# Patient Record
Sex: Female | Born: 1985 | Race: Black or African American | Hispanic: No | Marital: Single | Smoking: Current every day smoker
Health system: Southern US, Community
[De-identification: ages and names within clinical notes are randomized; demographics above are authoritative.]

## PROBLEM LIST (undated history)

## (undated) ENCOUNTER — Inpatient Hospital Stay (HOSPITAL_COMMUNITY): Payer: Self-pay

## (undated) DIAGNOSIS — G5603 Carpal tunnel syndrome, bilateral upper limbs: Secondary | ICD-10-CM

## (undated) DIAGNOSIS — E669 Obesity, unspecified: Secondary | ICD-10-CM

## (undated) DIAGNOSIS — G43909 Migraine, unspecified, not intractable, without status migrainosus: Secondary | ICD-10-CM

## (undated) HISTORY — PX: ANKLE ARTHROPLASTY: SUR68

---
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
## 2004-02-11 ENCOUNTER — Inpatient Hospital Stay (HOSPITAL_COMMUNITY): Admission: AD | Admit: 2004-02-11 | Discharge: 2004-02-11 | Payer: Self-pay | Admitting: Obstetrics and Gynecology

## 2004-03-05 ENCOUNTER — Emergency Department (HOSPITAL_COMMUNITY): Admission: EM | Admit: 2004-03-05 | Discharge: 2004-03-05 | Payer: Self-pay | Admitting: Emergency Medicine

## 2004-03-09 ENCOUNTER — Emergency Department (HOSPITAL_COMMUNITY): Admission: EM | Admit: 2004-03-09 | Discharge: 2004-03-09 | Payer: Self-pay | Admitting: Emergency Medicine

## 2004-03-23 ENCOUNTER — Other Ambulatory Visit: Admission: RE | Admit: 2004-03-23 | Discharge: 2004-03-23 | Payer: Self-pay | Admitting: Obstetrics and Gynecology

## 2004-04-22 ENCOUNTER — Ambulatory Visit (HOSPITAL_COMMUNITY): Admission: RE | Admit: 2004-04-22 | Discharge: 2004-04-22 | Payer: Self-pay | Admitting: *Deleted

## 2004-05-18 ENCOUNTER — Ambulatory Visit: Payer: Self-pay | Admitting: Family Medicine

## 2004-05-27 ENCOUNTER — Inpatient Hospital Stay (HOSPITAL_COMMUNITY): Admission: AD | Admit: 2004-05-27 | Discharge: 2004-05-27 | Payer: Self-pay | Admitting: *Deleted

## 2004-05-27 ENCOUNTER — Ambulatory Visit: Payer: Self-pay | Admitting: Family Medicine

## 2004-06-06 ENCOUNTER — Inpatient Hospital Stay (HOSPITAL_COMMUNITY): Admission: AD | Admit: 2004-06-06 | Discharge: 2004-06-06 | Payer: Self-pay | Admitting: Family Medicine

## 2004-09-13 ENCOUNTER — Inpatient Hospital Stay (HOSPITAL_COMMUNITY): Admission: AD | Admit: 2004-09-13 | Discharge: 2004-09-15 | Payer: Self-pay | Admitting: Obstetrics and Gynecology

## 2004-09-13 ENCOUNTER — Ambulatory Visit: Payer: Self-pay | Admitting: Obstetrics & Gynecology

## 2004-11-12 ENCOUNTER — Emergency Department (HOSPITAL_COMMUNITY): Admission: EM | Admit: 2004-11-12 | Discharge: 2004-11-12 | Payer: Self-pay | Admitting: Emergency Medicine

## 2004-12-30 ENCOUNTER — Emergency Department (HOSPITAL_COMMUNITY): Admission: EM | Admit: 2004-12-30 | Discharge: 2004-12-30 | Payer: Self-pay | Admitting: Emergency Medicine

## 2005-03-28 ENCOUNTER — Emergency Department (HOSPITAL_COMMUNITY): Admission: EM | Admit: 2005-03-28 | Discharge: 2005-03-28 | Payer: Self-pay | Admitting: Emergency Medicine

## 2005-03-30 ENCOUNTER — Emergency Department (HOSPITAL_COMMUNITY): Admission: EM | Admit: 2005-03-30 | Discharge: 2005-03-30 | Payer: Self-pay | Admitting: Emergency Medicine

## 2005-06-24 ENCOUNTER — Emergency Department (HOSPITAL_COMMUNITY): Admission: EM | Admit: 2005-06-24 | Discharge: 2005-06-24 | Payer: Self-pay | Admitting: Emergency Medicine

## 2005-08-26 ENCOUNTER — Inpatient Hospital Stay (HOSPITAL_COMMUNITY): Admission: AD | Admit: 2005-08-26 | Discharge: 2005-08-26 | Payer: Self-pay | Admitting: *Deleted

## 2005-09-16 ENCOUNTER — Inpatient Hospital Stay (HOSPITAL_COMMUNITY): Admission: AD | Admit: 2005-09-16 | Discharge: 2005-09-16 | Payer: Self-pay | Admitting: *Deleted

## 2005-11-09 ENCOUNTER — Ambulatory Visit (HOSPITAL_COMMUNITY): Admission: RE | Admit: 2005-11-09 | Discharge: 2005-11-09 | Payer: Self-pay | Admitting: Obstetrics

## 2006-01-20 ENCOUNTER — Ambulatory Visit (HOSPITAL_COMMUNITY): Admission: RE | Admit: 2006-01-20 | Discharge: 2006-01-20 | Payer: Self-pay | Admitting: Obstetrics

## 2006-02-16 ENCOUNTER — Inpatient Hospital Stay (HOSPITAL_COMMUNITY): Admission: AD | Admit: 2006-02-16 | Discharge: 2006-02-16 | Payer: Self-pay | Admitting: Obstetrics & Gynecology

## 2006-02-20 ENCOUNTER — Emergency Department (HOSPITAL_COMMUNITY): Admission: EM | Admit: 2006-02-20 | Discharge: 2006-02-21 | Payer: Self-pay | Admitting: Emergency Medicine

## 2006-03-02 ENCOUNTER — Ambulatory Visit (HOSPITAL_COMMUNITY): Admission: RE | Admit: 2006-03-02 | Discharge: 2006-03-03 | Payer: Self-pay | Admitting: Orthopedic Surgery

## 2006-03-06 ENCOUNTER — Emergency Department (HOSPITAL_COMMUNITY): Admission: EM | Admit: 2006-03-06 | Discharge: 2006-03-06 | Payer: Self-pay | Admitting: Emergency Medicine

## 2006-03-22 ENCOUNTER — Inpatient Hospital Stay (HOSPITAL_COMMUNITY): Admission: AD | Admit: 2006-03-22 | Discharge: 2006-03-22 | Payer: Self-pay | Admitting: Obstetrics

## 2006-03-30 ENCOUNTER — Encounter (INDEPENDENT_AMBULATORY_CARE_PROVIDER_SITE_OTHER): Payer: Self-pay | Admitting: *Deleted

## 2006-03-30 ENCOUNTER — Inpatient Hospital Stay (HOSPITAL_COMMUNITY): Admission: AD | Admit: 2006-03-30 | Discharge: 2006-04-01 | Payer: Self-pay | Admitting: Obstetrics

## 2006-05-14 ENCOUNTER — Emergency Department (HOSPITAL_COMMUNITY): Admission: EM | Admit: 2006-05-14 | Discharge: 2006-05-14 | Payer: Self-pay | Admitting: Emergency Medicine

## 2006-07-27 ENCOUNTER — Emergency Department (HOSPITAL_COMMUNITY): Admission: EM | Admit: 2006-07-27 | Discharge: 2006-07-27 | Payer: Self-pay | Admitting: Emergency Medicine

## 2006-08-06 IMAGING — US US OB FOLLOW-UP
1 series · 13 of 28 positions shown · non-contrast
Comparison: none

CLINICAL DATA: 30 week 1 day assigned gestational age by prior ultrasound.  Follow-up fetal anatomy not visualized on prior study.  Evaluate growth.

[Series 1: us ob follow-up · 13 of 51 slices shown]
[im 2/51]
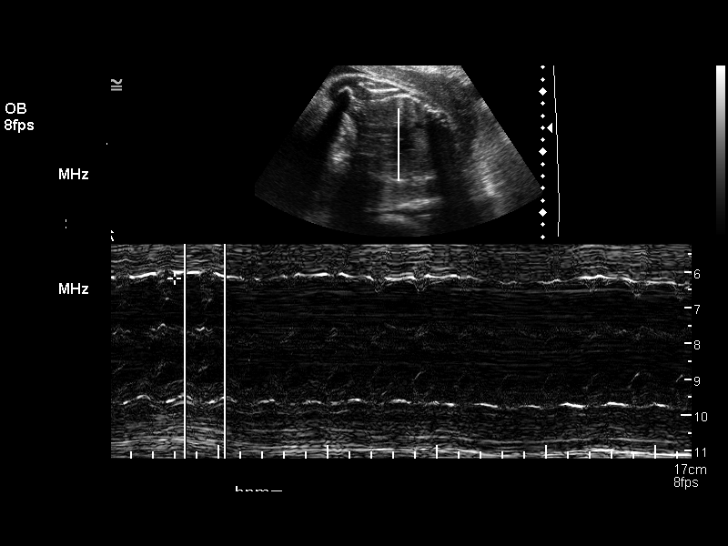
[im 6/51]
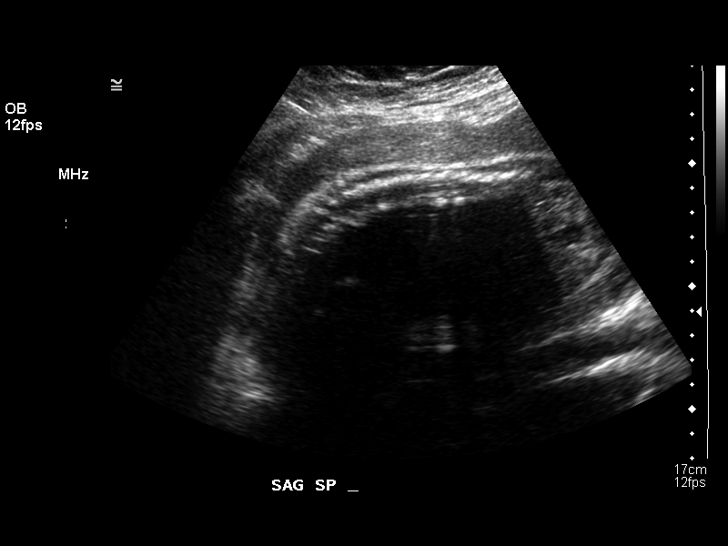
[im 10/51]
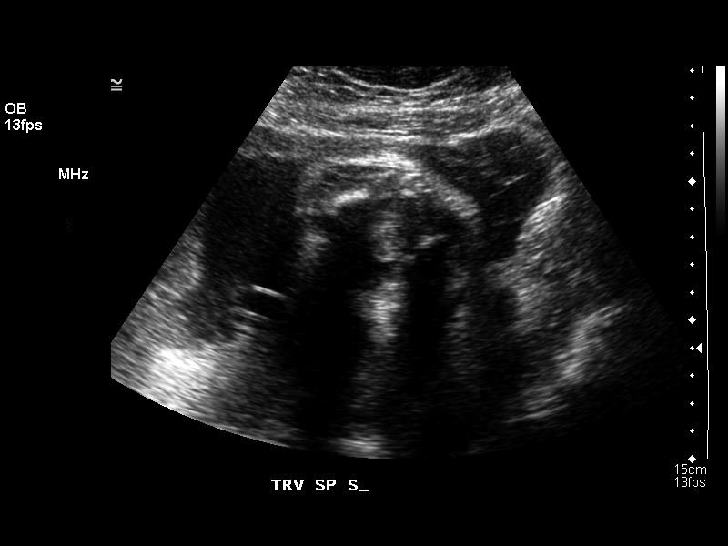
[im 13/51]
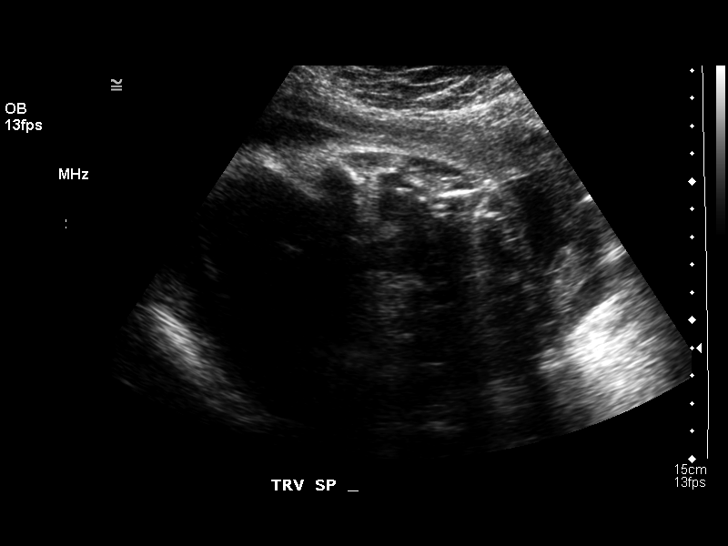
[im 17/51]
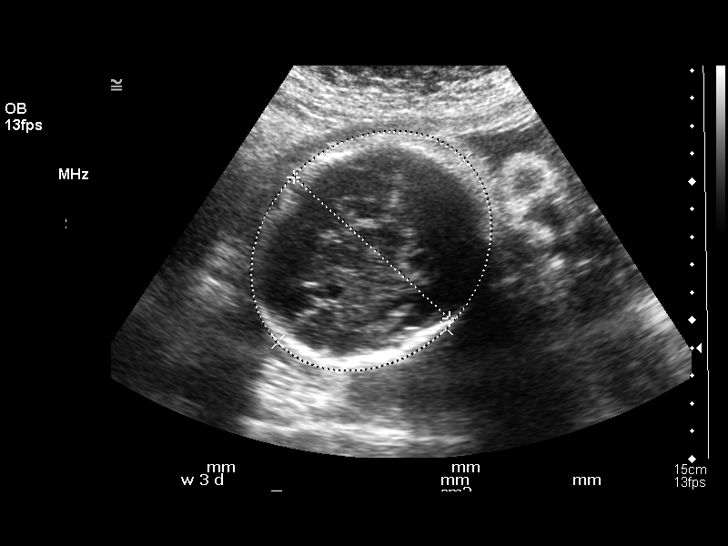
[im 21/51]
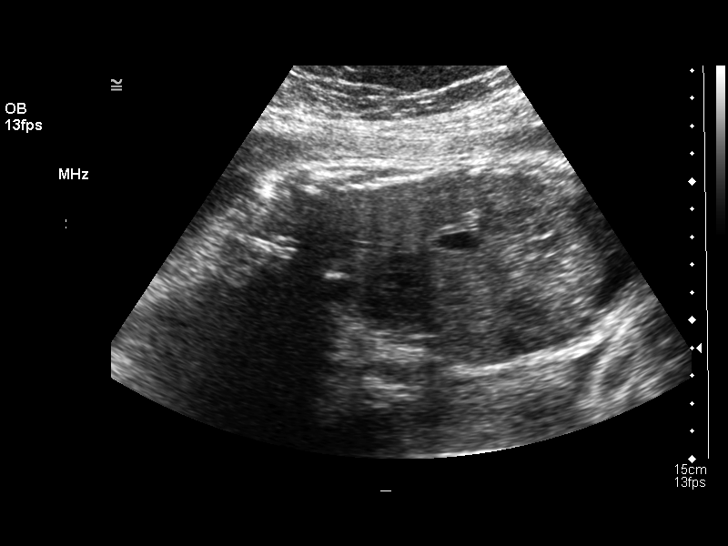
[im 26/51]
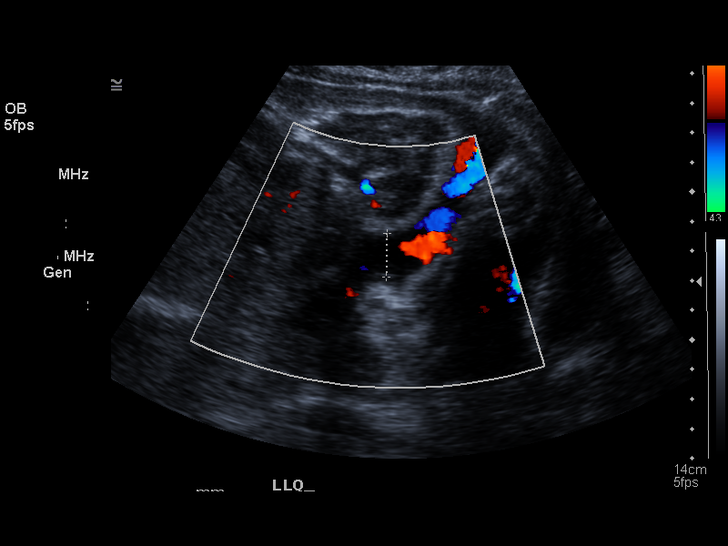
[im 30/51]
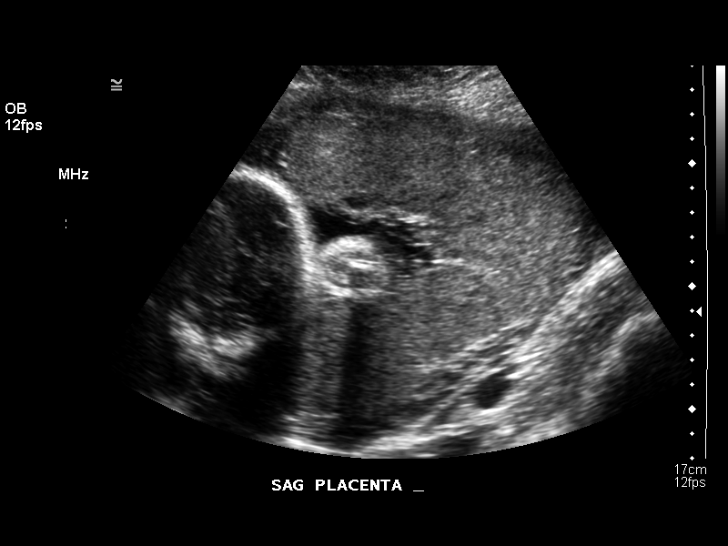
[im 34/51]
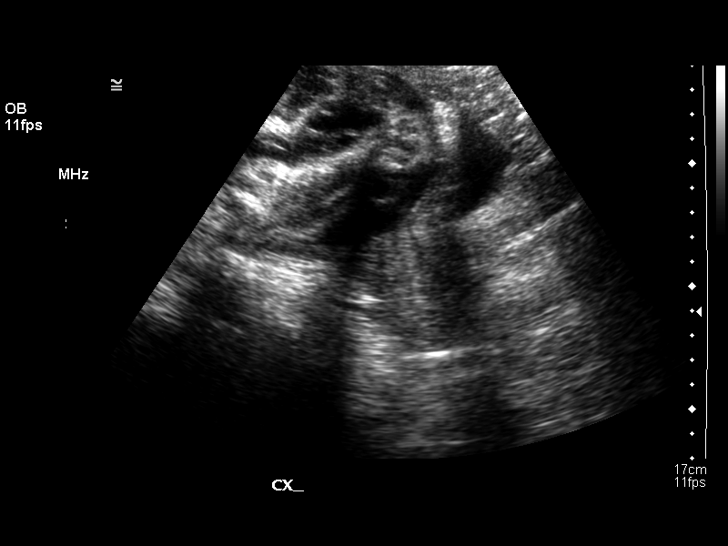
[im 38/51]
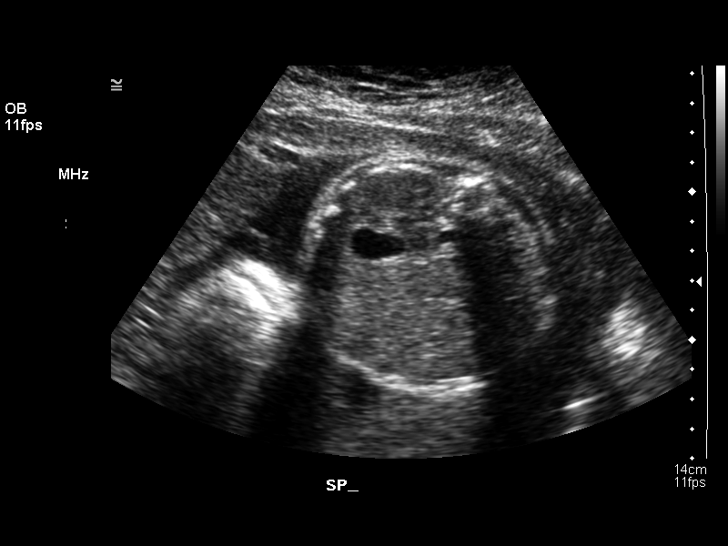
[im 41/51]
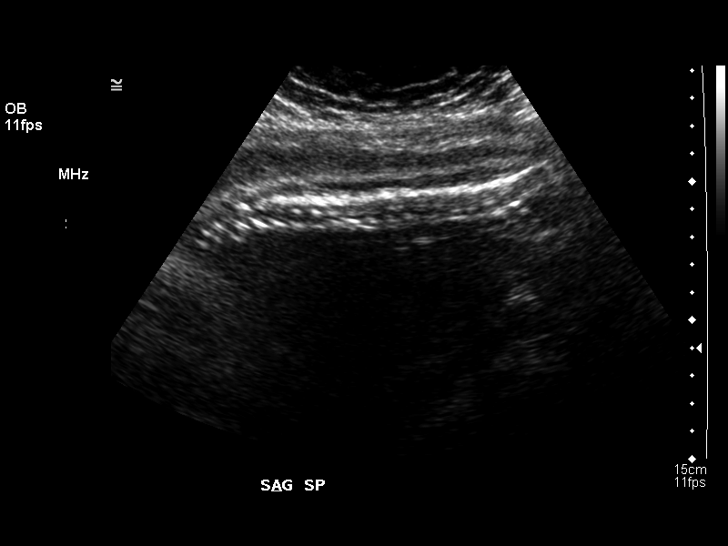
[im 45/51]
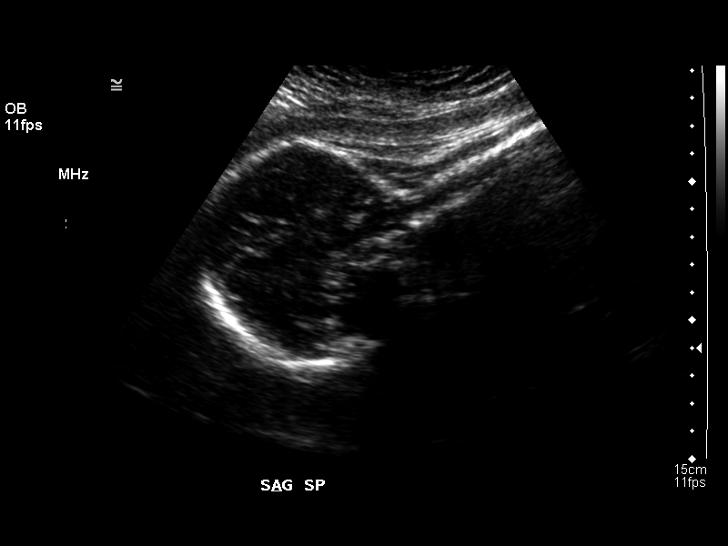
[im 49/51]
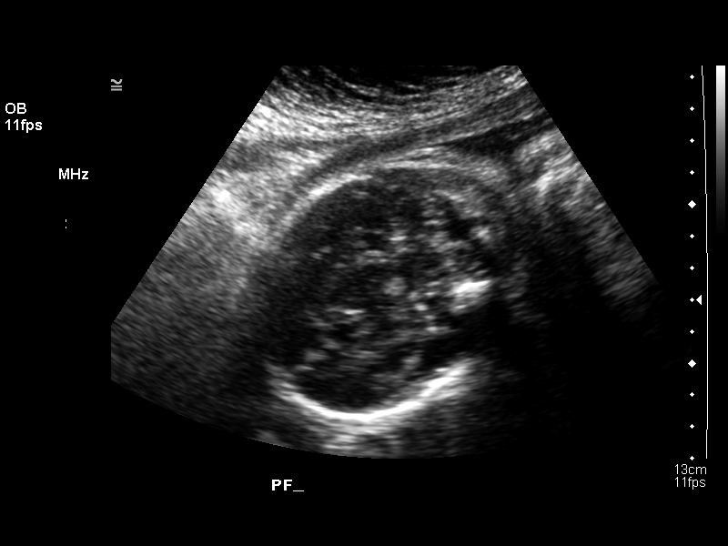

[13 of 28 positions shown; findings below may reference images not displayed]

OBSTETRICAL ULTRASOUND RE-EVALUATION:
 Number of Fetuses: 1
 Heart Rate:  163
 Movement:  Yes
 Breathing:  No
 Presentation:  Frank breech
 Placental Location:  Right lateral 
 Grade:  I
 Previa:  No
 Amniotic Fluid (subjective):  Normal
 Amniotic Fluid (objective):  14.9 cm AFI (5th -95th%ile = 9.0 – 23.4 cm for 30 wks)

 FETAL BIOMETRY
 BPD:  7.4 cm   29 w 4 d
 HC:  27.1 cm   29 w 4 d
 AC:  26.4 cm   30 w 4 d
 FL:  5.9 cm   30 w 6 d

 Mean GA:  30 w 1 d  US EDC:  03/30/06
 Assigned GA:  30 w 1 d  Assigned EDC:  03/30/06

 EFW: 4074 g (H) 75th – 90th%ile (5755 – 6773 g) For 30 wks

 FETAL ANATOMY
 Lateral Ventricles:  Visualized 
 Thalami/CSP:  Visualized 
 Posterior Fossa:  Visualized 
 Nuchal Region:  Previously seen 
 Spine:  Visualized 
 4 Chamber Heart on Left:  Previously seen 
 Stomach on Left:  Visualized 
 3 Vessel Cord:  Visualized 
 Cord Insertion Site:  Previously seen 
 Kidneys:  Visualized 
 Bladder:  Visualized 
 Extremities:  Previously seen 
 MATERNAL UTERINE AND ADNEXAL FINDINGS
 Cervix:4.7 cm Transabdominally.
IMPRESSION: 1.  Assigned gestational age is currently 30 weeks 1 day by prior ultrasound.  Appropriate fetal growth with EFW at 75th percentile.
 2.  No fetal anatomic abnormality identified.  Fetal spine visualized on today’s study.

## 2006-09-07 IMAGING — CR DG ANKLE PORT 2V*R*
2 series · 2 of 2 positions shown · non-contrast
Comparison: none

HISTORY: Bimalleolar fracture, postreduction

PORTABLE RIGHT ANKLE 2 VIEWS:
Portable exam 5665 hours compared to 02/20/2006
Bimalleolar fractures again seen.
Talar subluxation reduced.
Improved alignment medial malleolar fracture fragment.
Slightly increased displacement of distal fibular fracture.

[view not recorded (1 of 2)]
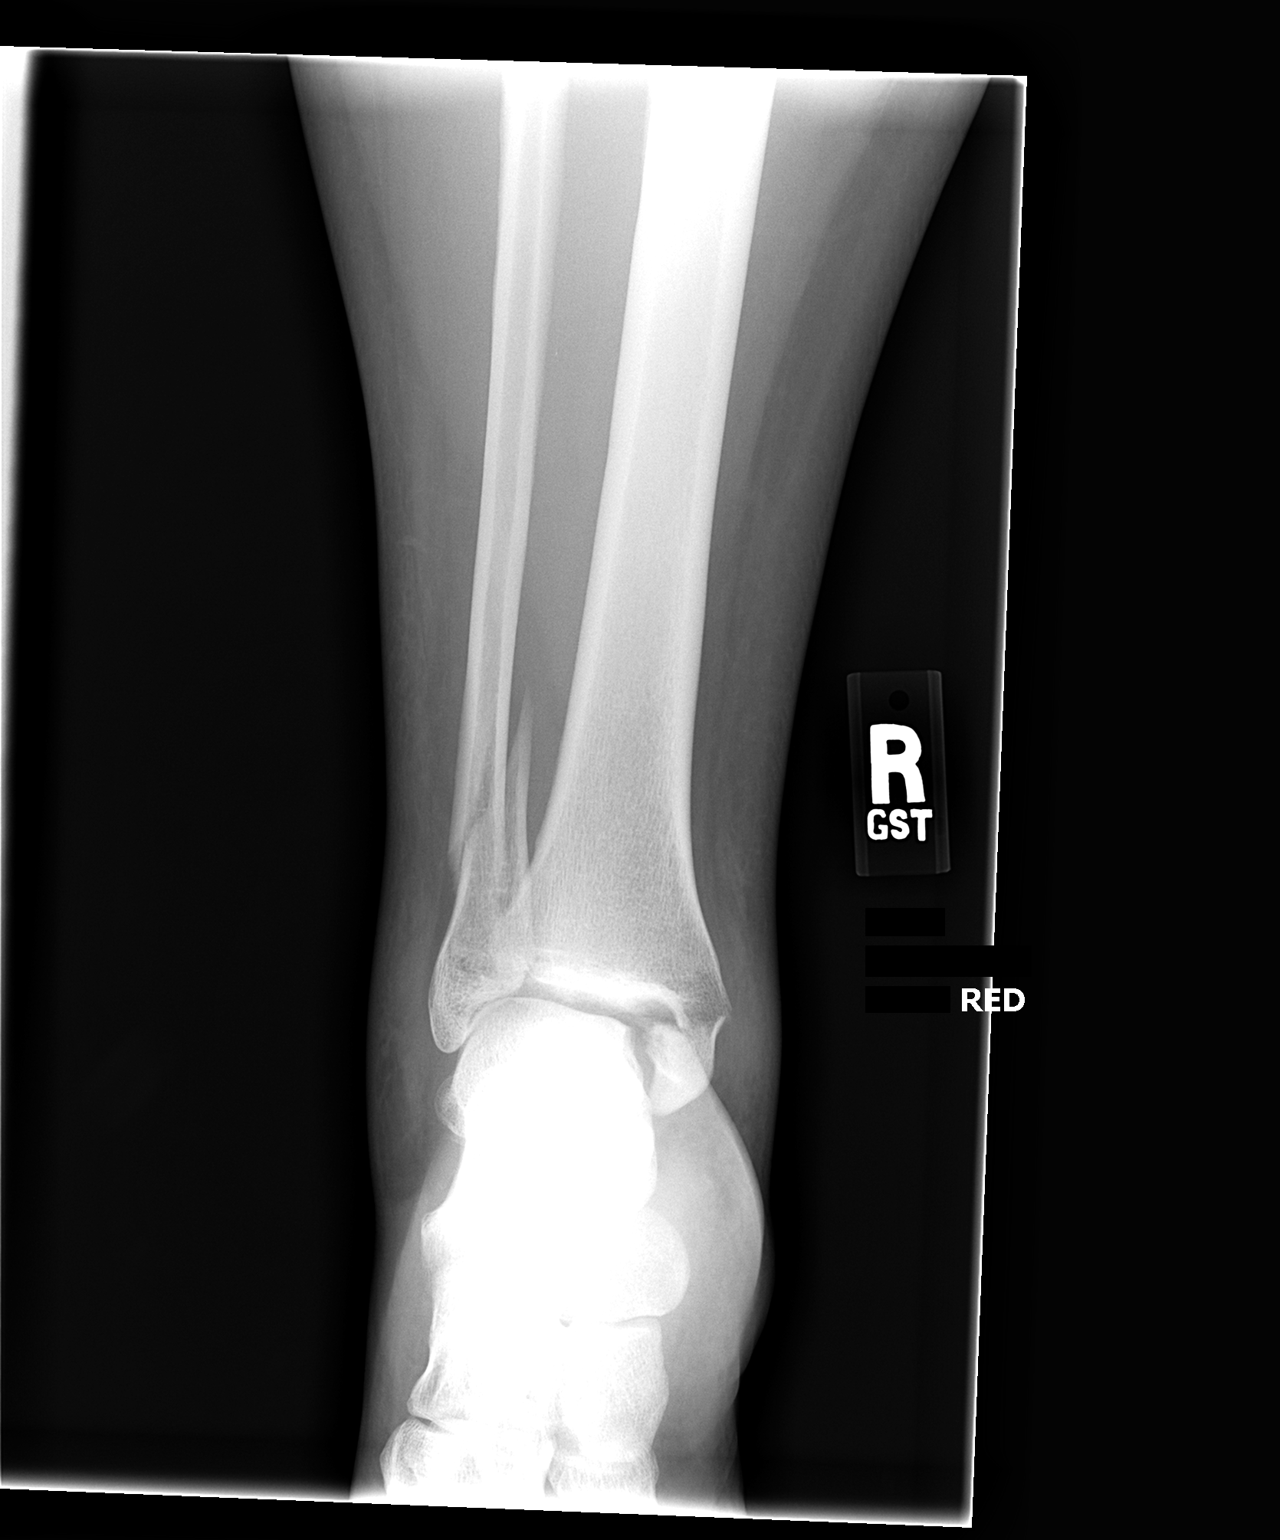

[view not recorded (2 of 2)]
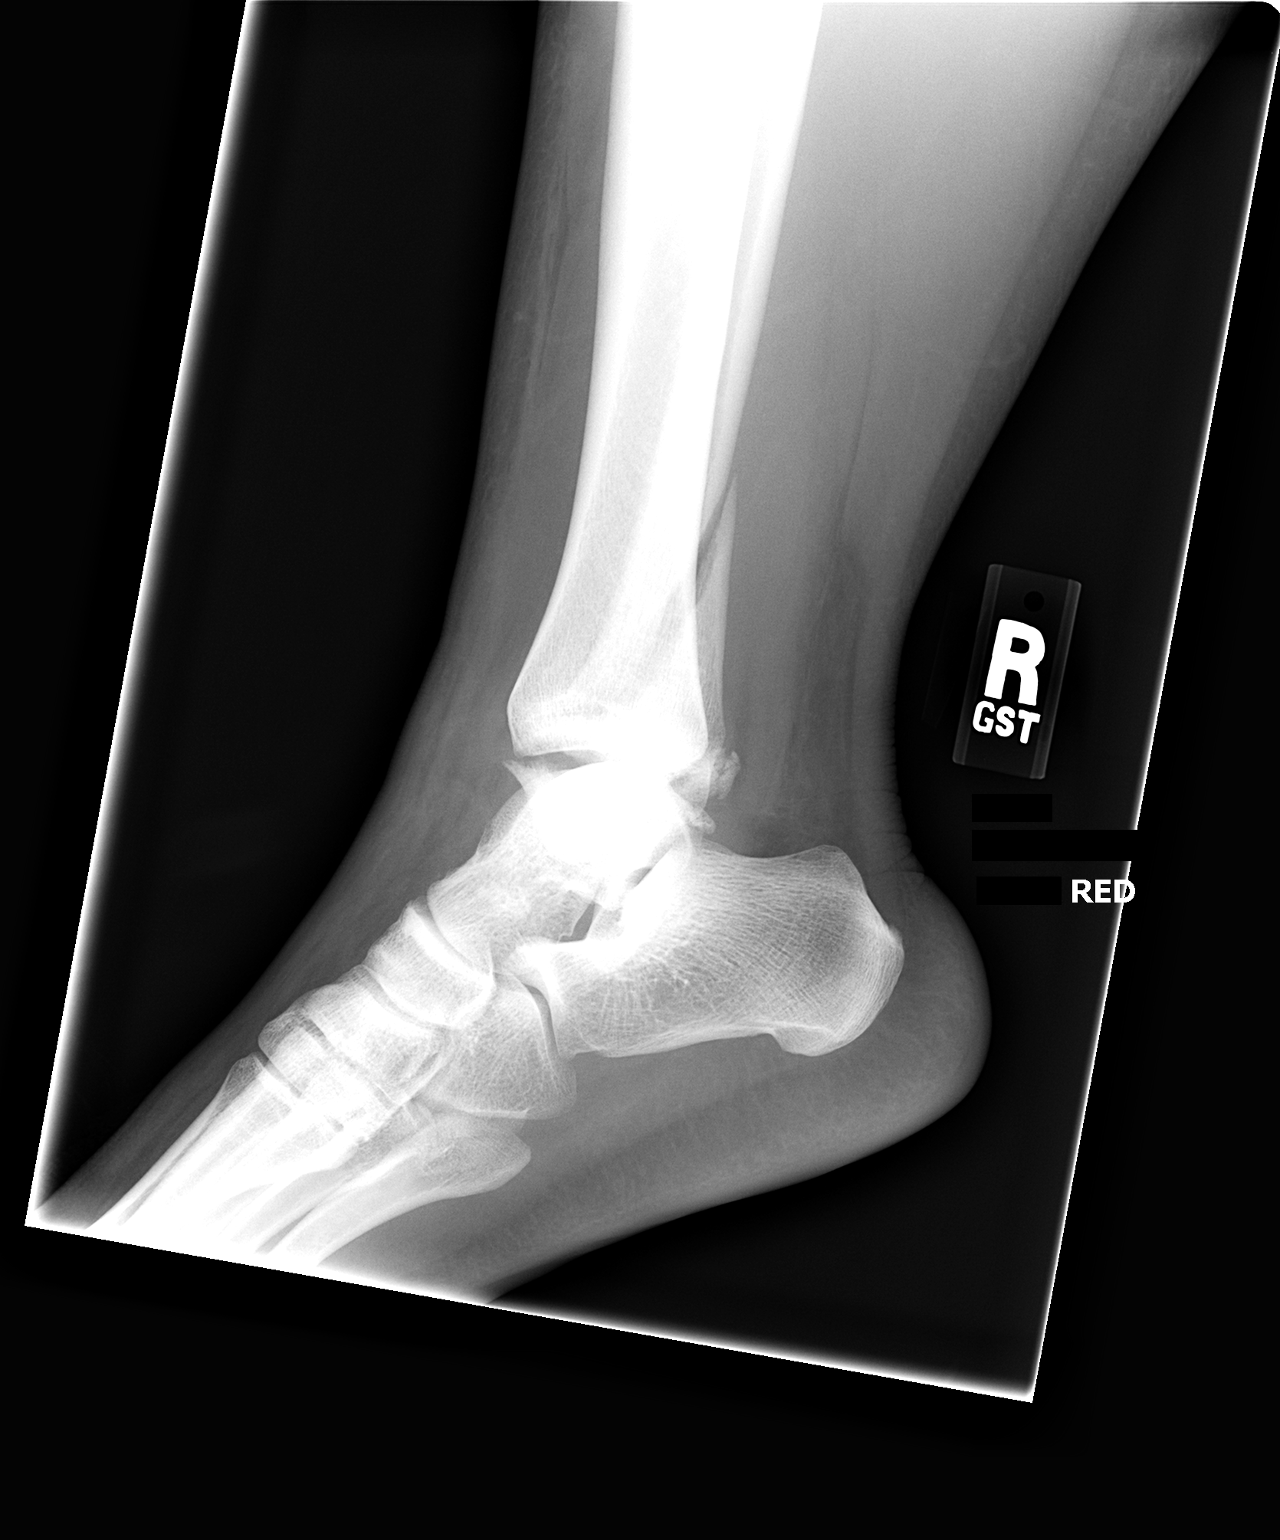

[2 of 2 positions shown; findings below may reference images not displayed]

IMPRESSION: Reduction of tibiotalar subluxation with bimalleolar fractures as above.

## 2006-09-08 ENCOUNTER — Emergency Department (HOSPITAL_COMMUNITY): Admission: EM | Admit: 2006-09-08 | Discharge: 2006-09-08 | Payer: Self-pay | Admitting: Emergency Medicine

## 2007-07-21 ENCOUNTER — Emergency Department (HOSPITAL_COMMUNITY): Admission: EM | Admit: 2007-07-21 | Discharge: 2007-07-21 | Payer: Self-pay | Admitting: Emergency Medicine

## 2007-07-31 ENCOUNTER — Emergency Department (HOSPITAL_COMMUNITY): Admission: EM | Admit: 2007-07-31 | Discharge: 2007-07-31 | Payer: Self-pay | Admitting: Emergency Medicine

## 2007-08-27 ENCOUNTER — Emergency Department (HOSPITAL_COMMUNITY): Admission: EM | Admit: 2007-08-27 | Discharge: 2007-08-27 | Payer: Self-pay | Admitting: Emergency Medicine

## 2007-11-09 ENCOUNTER — Emergency Department (HOSPITAL_COMMUNITY): Admission: EM | Admit: 2007-11-09 | Discharge: 2007-11-09 | Payer: Self-pay | Admitting: Emergency Medicine

## 2007-12-27 ENCOUNTER — Emergency Department (HOSPITAL_COMMUNITY): Admission: EM | Admit: 2007-12-27 | Discharge: 2007-12-27 | Payer: Self-pay | Admitting: Emergency Medicine

## 2008-01-07 ENCOUNTER — Emergency Department (HOSPITAL_COMMUNITY): Admission: EM | Admit: 2008-01-07 | Discharge: 2008-01-07 | Payer: Self-pay | Admitting: Emergency Medicine

## 2008-01-11 ENCOUNTER — Emergency Department (HOSPITAL_COMMUNITY): Admission: EM | Admit: 2008-01-11 | Discharge: 2008-01-11 | Payer: Self-pay | Admitting: Emergency Medicine

## 2008-04-21 ENCOUNTER — Emergency Department (HOSPITAL_COMMUNITY): Admission: EM | Admit: 2008-04-21 | Discharge: 2008-04-21 | Payer: Self-pay | Admitting: Family Medicine

## 2008-06-19 ENCOUNTER — Emergency Department (HOSPITAL_COMMUNITY): Admission: EM | Admit: 2008-06-19 | Discharge: 2008-06-19 | Payer: Self-pay | Admitting: Emergency Medicine

## 2008-10-17 ENCOUNTER — Emergency Department (HOSPITAL_COMMUNITY): Admission: EM | Admit: 2008-10-17 | Discharge: 2008-10-18 | Payer: Self-pay | Admitting: Emergency Medicine

## 2009-01-06 ENCOUNTER — Emergency Department (HOSPITAL_COMMUNITY): Admission: EM | Admit: 2009-01-06 | Discharge: 2009-01-06 | Payer: Self-pay | Admitting: Emergency Medicine

## 2009-01-18 ENCOUNTER — Emergency Department (HOSPITAL_COMMUNITY): Admission: EM | Admit: 2009-01-18 | Discharge: 2009-01-18 | Payer: Self-pay | Admitting: Family Medicine

## 2010-02-13 ENCOUNTER — Inpatient Hospital Stay (HOSPITAL_COMMUNITY): Admission: AD | Admit: 2010-02-13 | Discharge: 2010-02-13 | Payer: Self-pay | Admitting: Obstetrics

## 2010-02-13 ENCOUNTER — Ambulatory Visit: Payer: Self-pay | Admitting: Nurse Practitioner

## 2010-04-11 ENCOUNTER — Emergency Department (HOSPITAL_COMMUNITY): Admission: EM | Admit: 2010-04-11 | Discharge: 2010-04-11 | Payer: Self-pay | Admitting: Emergency Medicine

## 2010-04-12 ENCOUNTER — Emergency Department (HOSPITAL_COMMUNITY): Admission: EM | Admit: 2010-04-12 | Discharge: 2010-04-12 | Payer: Self-pay | Admitting: Emergency Medicine

## 2010-04-14 ENCOUNTER — Emergency Department (HOSPITAL_COMMUNITY): Admission: EM | Admit: 2010-04-14 | Discharge: 2010-04-15 | Payer: Self-pay | Admitting: Emergency Medicine

## 2010-11-04 LAB — CULTURE, ROUTINE-ABSCESS

## 2010-11-04 LAB — POCT PREGNANCY, URINE: Preg Test, Ur: NEGATIVE

## 2010-11-07 LAB — CBC
Hemoglobin: 13.1 g/dL (ref 12.0–15.0)
MCH: 22.7 pg — ABNORMAL LOW (ref 26.0–34.0)
Platelets: 294 10*3/uL (ref 150–400)
RBC: 5.79 MIL/uL — ABNORMAL HIGH (ref 3.87–5.11)
RDW: 15.3 % (ref 11.5–15.5)

## 2010-11-07 LAB — POCT PREGNANCY, URINE: Preg Test, Ur: NEGATIVE

## 2010-11-30 LAB — WET PREP, GENITAL: Yeast Wet Prep HPF POC: NONE SEEN

## 2010-11-30 LAB — COMPREHENSIVE METABOLIC PANEL
ALT: 49 U/L — ABNORMAL HIGH (ref 0–35)
Albumin: 4.1 g/dL (ref 3.5–5.2)
CO2: 21 mEq/L (ref 19–32)
Calcium: 9.6 mg/dL (ref 8.4–10.5)
GFR calc non Af Amer: >60 mL/min (ref >60)
Sodium: 142 mEq/L (ref 135–145)
Total Bilirubin: 0.7 mg/dL (ref 0.3–1.2)

## 2010-11-30 LAB — URINALYSIS, ROUTINE W REFLEX MICROSCOPIC
Glucose, UA: NEGATIVE mg/dL
Nitrite: NEGATIVE
Protein, ur: NEGATIVE mg/dL
Specific Gravity, Urine: 1.023 (ref 1.005–1.030)
pH: 5.5 (ref 5.0–8.0)

## 2010-11-30 LAB — GC/CHLAMYDIA PROBE AMP, GENITAL: Chlamydia, DNA Probe: NEGATIVE

## 2010-11-30 LAB — PREGNANCY, URINE: Preg Test, Ur: NEGATIVE

## 2010-12-12 ENCOUNTER — Inpatient Hospital Stay (HOSPITAL_COMMUNITY): Payer: Medicaid Other

## 2010-12-12 ENCOUNTER — Inpatient Hospital Stay (HOSPITAL_COMMUNITY)
Admission: AD | Admit: 2010-12-12 | Discharge: 2010-12-12 | Disposition: A | Discharge status: Home / Self Care | Payer: Medicaid Other | Source: Ambulatory Visit | Attending: Obstetrics & Gynecology | Admitting: Obstetrics & Gynecology

## 2010-12-12 DIAGNOSIS — N760 Acute vaginitis: Secondary | ICD-10-CM

## 2010-12-12 DIAGNOSIS — A499 Bacterial infection, unspecified: Secondary | ICD-10-CM

## 2010-12-12 DIAGNOSIS — B9689 Other specified bacterial agents as the cause of diseases classified elsewhere: Secondary | ICD-10-CM | POA: Insufficient documentation

## 2010-12-12 DIAGNOSIS — O2390 Unspecified genitourinary tract infection in pregnancy, unspecified trimester: Secondary | ICD-10-CM | POA: Insufficient documentation

## 2010-12-12 DIAGNOSIS — R109 Unspecified abdominal pain: Secondary | ICD-10-CM | POA: Insufficient documentation

## 2010-12-12 DIAGNOSIS — N390 Urinary tract infection, site not specified: Secondary | ICD-10-CM

## 2010-12-12 LAB — URINE MICROSCOPIC-ADD ON

## 2010-12-12 LAB — CBC
HCT: 37.6 % (ref 36.0–46.0)
MCH: 21.5 pg — ABNORMAL LOW (ref 26.0–34.0)
Platelets: 251 10*3/uL (ref 150–400)

## 2010-12-12 LAB — URINALYSIS, ROUTINE W REFLEX MICROSCOPIC
Ketones, ur: NEGATIVE mg/dL
Nitrite: NEGATIVE
Urobilinogen, UA: 0.2 mg/dL (ref 0.0–1.0)
pH: 6 (ref 5.0–8.0)

## 2010-12-12 LAB — WET PREP, GENITAL

## 2010-12-12 LAB — HCG, QUANTITATIVE, PREGNANCY: hCG, Beta Chain, Quant, S: 117094 m[IU]/mL — ABNORMAL HIGH (ref <5)

## 2010-12-12 LAB — POCT PREGNANCY, URINE: Preg Test, Ur: POSITIVE

## 2010-12-13 LAB — GC/CHLAMYDIA PROBE AMP, GENITAL: GC Probe Amp, Genital: NEGATIVE

## 2010-12-14 LAB — URINE CULTURE
Colony Count: >=100000
Culture  Setup Time: 201204221718

## 2011-01-07 NOTE — Discharge Summary (Signed)
   NAME:  Donna Sanders, Donna Sanders                          ACCOUNT NO.:  [id number]   MEDICAL RECORD NO.:  [id number]                   PATIENT TYPE:  INP   LOCATION:  9115                                 FACILITY:  [facility]   PHYSICIAN:  Rodolph Bong, M.D.                  DATE OF BIRTH:  05-02-1986   DATE OF ADMISSION:  09/14/2002  DATE OF DISCHARGE:  09/16/2002                                 DISCHARGE SUMMARY   DISCHARGE DIAGNOSIS:  Normal spontaneous vaginal delivery at 40-0/7 weeks of  a viable female infant with vasa previa/postpartum hemorrhage.   DISCHARGE MEDICATIONS:  1. Ibuprofen 600 mg p.o. q.6h. p.r.n. pain.  2. Ortho-Evra patch one patch every week for three weeks and then one week     patch-free.  3. Ferrous sulfate 325 mg one pill each day with a meal.   BRIEF ADMISSION HISTORY:  This is a 25 year old G1, P0 who presented with an  intrauterine pregnancy at 39 weeks in latent labor.  She was admitted for  further expectant management.   BRIEF HOSPITAL COURSE:  Pregnancy.  The patient was admitted with the  history as outlined above.  She progressed without complications into active  labor and delivered a viable female infant by a spontaneous vaginal delivery  at 0449 hours on September 15, 2002.  Of note, during delivery, patient was  identified to have a vasa previa on the placenta.  She initially did have a  boggy uterus with brisk bleeding.  She received Oxytocin 4 units in her IV  fluids and Cytotec 800 mcg per rectum in addition to uterine massage.  She  had a second-degree laceration repair that was fixed without complication.  The NICU was present at birth secondary to meconium-stained fluid.  The baby  had Apgars of 9 at 1 minute and 9 at 5 minutes.  Over the next two days, the  patient did not have any complications.  She was afebrile and feeling well.  She was discharged on September 16, 2002, in stable condition.   DISCHARGE ACTIVITY:  Pelvic rest was advised for six  weeks.   FOLLOW UP:  At [facility] in six weeks.   LABORATORY DATA:  Discharge labs, hemoglobin 9.0 and hematocrit 27.4 on  September 16, 2002.                                               Rodolph Bong, M.D.    AK/MEDQ  D:  01/01/2003  T:  01/01/2003  Job:  [id number]

## 2011-01-07 NOTE — Op Note (Signed)
Donna Sanders, Donna Sanders NO.:  [id number]   MEDICAL RECORD NO.:  [id number]          PATIENT TYPE:  OIB   LOCATION:  5025                         FACILITY:  [facility]   PHYSICIAN:  Loreta Ave, M.D. DATE OF BIRTH:  May 06, 1986   DATE OF PROCEDURE:  03/02/2006  DATE OF DISCHARGE:  03/03/2006                                 OPERATIVE REPORT   PREOPERATIVE DIAGNOSIS:  Displaced, closed bimalleolar ankle fracture,  right.  Note:  The patient is 8 months pregnant.   POSTOPERATIVE DIAGNOSIS:  Displaced, closed trimalleolar ankle fracture,  right.   PROCEDURE:  Open reduction internal fixation of right ankle fracture,  including fixation of the medial and lateral fractures and closed treatment  of the posterior fracture, medial fixation with 3 cannulated 4.0 lag screws,  lateral fixation with a locking Synthes 6-hole plate.   SURGEON:  Loreta Ave, M.D.   ASSISTANT:  Genene Churn. Denton Meek.   ANESTHESIA:  Spinal.   BLOOD LOSS:  Minimal.   TOURNIQUET TIME:  45 minutes.   SPECIMENS:  None.   CULTURES:  None.   COMPLICATIONS:  None.   DRESSING:  Soft compressive with splint.   DESCRIPTION OF THE PROCEDURE:  The patient was brought to the operating  room, and after adequate anesthesia had been obtained, the tourniquet was  applied to the upper aspect of the right leg.  Splint removed.  Prepped and  draped in the usual, sterile fashion.  Exsanguinated with elevation of  Esmarch.  Tourniquet inflated to 300 mmHg.  Slightly curved incision over  the medial malleolus.  Skin, subcutaneous tissue divided.  Fracture exposed.  Debris cleared out.  Ankle inspected.  Thoroughly irrigated.  Fracture  reduced anatomically.  Relatively large.  After holding it in place  anatomically, 3 guidewire's were passed across the fracture, predrilled and  fixed with 3 cannulated screws.  Excellent alignment, capture and fixation.  All guidewire's were removed.  Lateral approach  with a longitudinal  incision.  Subperiosteal exposure of the fibula.  Tendons protected.  Anatomically reduced and then fixed with a 6-hole, 1/3 tubular locking  plate.  Care taken not to leave screws in the ankle or syndesmosis.  Syndesmosis itself was not disrupted as it was a spiral fracture that  started below the syndesmosis.  At completion, excellent solid and stable  fixation of all fractures.  Posterior fragment in place did not require  further fixation.  Thoroughly inspected as well as fluoroscopic views  through full motion.  The wound was irrigated, closed with  Vicryl, and then staples.  The margins of the wound was injected with  Marcaine.  A sterile compressive dressing and a splint applied.  Tourniquet  inflated removed.  Anesthesia reversed.  Brought to the recovery room.  Tolerated the surgery well.  No complications.      Loreta Ave, M.D.  Electronically Signed     DFM/MEDQ  D:  03/02/2006  T:  03/03/2006  Job:  [id number]

## 2011-05-18 LAB — POCT URINALYSIS DIP (DEVICE)
Glucose, UA: NEGATIVE
Hgb urine dipstick: NEGATIVE
Nitrite: NEGATIVE
Urobilinogen, UA: 1

## 2011-05-18 LAB — POCT PREGNANCY, URINE
Operator id: 114141
Preg Test, Ur: NEGATIVE

## 2011-05-18 LAB — WET PREP, GENITAL

## 2011-05-18 LAB — GC/CHLAMYDIA PROBE AMP, GENITAL: Chlamydia, DNA Probe: NEGATIVE

## 2011-08-11 ENCOUNTER — Emergency Department (HOSPITAL_COMMUNITY)
Admission: EM | Admit: 2011-08-11 | Discharge: 2011-08-12 | Disposition: A | Discharge status: Home / Self Care | Payer: Medicaid Other | Attending: Emergency Medicine | Admitting: Emergency Medicine

## 2011-08-11 ENCOUNTER — Encounter: Payer: Self-pay | Admitting: Emergency Medicine

## 2011-08-11 DIAGNOSIS — F17200 Nicotine dependence, unspecified, uncomplicated: Secondary | ICD-10-CM | POA: Insufficient documentation

## 2011-08-11 DIAGNOSIS — IMO0002 Reserved for concepts with insufficient information to code with codable children: Secondary | ICD-10-CM | POA: Insufficient documentation

## 2011-08-11 DIAGNOSIS — M79609 Pain in unspecified limb: Secondary | ICD-10-CM | POA: Insufficient documentation

## 2011-08-11 DIAGNOSIS — M7989 Other specified soft tissue disorders: Secondary | ICD-10-CM | POA: Insufficient documentation

## 2011-08-11 DIAGNOSIS — J45909 Unspecified asthma, uncomplicated: Secondary | ICD-10-CM | POA: Insufficient documentation

## 2011-08-11 DIAGNOSIS — L02412 Cutaneous abscess of left axilla: Secondary | ICD-10-CM

## 2011-08-11 HISTORY — DX: Obesity, unspecified: E66.9

## 2011-08-11 NOTE — ED Notes (Signed)
PT. REPORTS LEFT AXILLARY ABSCESS WITH PAIN AND SWELLING FOR 2 DAYS WITH NO DRAINAGE.

## 2011-08-12 MED ORDER — DOXYCYCLINE HYCLATE 100 MG PO CAPS: 100.0000 mg | ORAL_CAPSULE | Freq: Two times a day (BID) | ORAL | Status: AC

## 2011-08-12 MED ORDER — HYDROCODONE-ACETAMINOPHEN 5-325 MG PO TABS: 1.0000 | ORAL_TABLET | Freq: Four times a day (QID) | ORAL | Status: AC | PRN

## 2011-08-12 NOTE — ED Notes (Signed)
Patient presents with boil under her left arm.  Has had this before

## 2011-08-12 NOTE — ED Provider Notes (Signed)
History     CSN: [id number]  Arrival date & time 08/11/11  2335   First MD Initiated Contact with Patient 08/12/11 0120      Chief Complaint  Patient presents with  . Recurrent Skin Infections     HPI  Donna Sanders is a 25 y.o. female who presents to the Emergency Department complaining of abscess to left axilla for past 2 days.  Hx provided by pt.  Pt with hx of similar symptoms in past states she began having increased swelling and pain in left axilla similar to prior abscess over past 2 days. Pain is worse with pressure or movements of arm.  There has been no bleeding or drainage. Pt has taken Ibuprofen with minimal improvement of pain.  Pt denies fever, chills, sweats, N/V.  Pt has no other significant PMH.     Past Medical History  Diagnosis Date  . Asthma   . Obesity     Past Surgical History  Procedure Date  . Ankle arthroplasty     No family history on file.  History  Substance Use Topics  . Smoking status: Current Everyday Smoker  . Smokeless tobacco: Not on file  . Alcohol Use: No    OB History    Grav Para Term Preterm Abortions TAB SAB Ect Mult Living                  Review of Systems  Constitutional: Negative for fever and chills.  All other systems reviewed and are negative.    Allergies  Penicillins  Home Medications   Current Outpatient Rx  Name Route Sig Dispense Refill  . IBUPROFEN 200 MG PO TABS Oral Take 400 mg by mouth every 6 (six) hours as needed. For pain       BP 124/73  Pulse 111  Temp(Src) 98.8 F (37.1 C) (Oral)  Resp 16  SpO2 98%  LMP 08/05/2011  Physical Exam  Nursing note and vitals reviewed. Constitutional: She is oriented to person, place, and time. She appears well-developed and well-nourished. No distress.  HENT:  Head: Normocephalic.  Cardiovascular: Normal rate and regular rhythm.   Pulmonary/Chest: Effort normal and breath sounds normal.  Abdominal: Soft.  Musculoskeletal: She exhibits no edema  and no tenderness.       Slightly reduced ROM in left shoulder secondary to pain from abscess.    Neurological: She is alert and oriented to person, place, and time.  Skin: Skin is warm and dry. No rash noted.       3-4 cm Fluctuant tender nodule in left axilla with mild erythema of skin.  Psychiatric: She has a normal mood and affect. Her behavior is normal.    ED Course  Procedures (including critical care time)  INCISION AND DRAINAGE Performed by: Angus Seller Consent: Verbal consent obtained. Risks and benefits: risks, benefits and alternatives were discussed Type: abscess  Body area: Left axila  Anesthesia: local infiltration  Local anesthetic: lidocaine 2% with epinephrine  Anesthetic total: 8 ml  Complexity: complex Blunt dissection to break up loculations  Drainage: purulent  Drainage amount: Large  Packing material: 1/4 in iodoform gauze  Patient tolerance: Patient tolerated the procedure well with no immediate complications.       1. Abscess of axilla, left       MDM  Pt seen and evaluated.  Pt in no acute distress.       Angus Seller, [place] 08/13/11 [id number]

## 2011-08-12 NOTE — ED Notes (Signed)
PA in to see patient.

## 2011-08-18 NOTE — ED Provider Notes (Signed)
Medical screening examination/treatment/procedure(s) were performed by non-physician practitioner and as supervising physician I was immediately available for consultation/collaboration.  Jasmine Awe, MD 08/18/11 2358

## 2012-11-01 ENCOUNTER — Emergency Department: Payer: Self-pay | Admitting: Emergency Medicine

## 2012-11-02 ENCOUNTER — Inpatient Hospital Stay (HOSPITAL_COMMUNITY): Payer: Medicaid Other

## 2012-11-02 ENCOUNTER — Inpatient Hospital Stay (HOSPITAL_COMMUNITY)
Admission: AD | Admit: 2012-11-02 | Discharge: 2012-11-02 | Disposition: A | Discharge status: Home / Self Care | Payer: Medicaid Other | Source: Ambulatory Visit | Attending: Obstetrics & Gynecology | Admitting: Obstetrics & Gynecology

## 2012-11-02 ENCOUNTER — Encounter (HOSPITAL_COMMUNITY): Payer: Self-pay

## 2012-11-02 DIAGNOSIS — R109 Unspecified abdominal pain: Secondary | ICD-10-CM | POA: Insufficient documentation

## 2012-11-02 DIAGNOSIS — O99891 Other specified diseases and conditions complicating pregnancy: Secondary | ICD-10-CM | POA: Insufficient documentation

## 2012-11-02 DIAGNOSIS — N949 Unspecified condition associated with female genital organs and menstrual cycle: Secondary | ICD-10-CM | POA: Insufficient documentation

## 2012-11-02 DIAGNOSIS — O26899 Other specified pregnancy related conditions, unspecified trimester: Secondary | ICD-10-CM

## 2012-11-02 LAB — URINALYSIS, ROUTINE W REFLEX MICROSCOPIC
Bilirubin Urine: NEGATIVE
Leukocytes, UA: NEGATIVE
Nitrite: NEGATIVE
Specific Gravity, Urine: 1.025 (ref 1.005–1.030)
pH: 6 (ref 5.0–8.0)

## 2012-11-02 LAB — WET PREP, GENITAL
Trich, Wet Prep: NONE SEEN
Yeast Wet Prep HPF POC: NONE SEEN

## 2012-11-02 LAB — POCT PREGNANCY, URINE: Preg Test, Ur: POSITIVE — AB

## 2012-11-02 NOTE — MAU Provider Note (Signed)
History     CSN: [id number]  Arrival date and time: 11/02/12 7829   First Provider Initiated Contact with Patient 11/02/12 1929      Chief Complaint  Patient presents with  . Abdominal Pain   HPI Pt is a G4P3003 at 18/.3 wks IUP here with report of abdominal cramping that started yesterday.  First prenatal appt is scheduled for 11/08/12 at [facility].   Patient's last menstrual period was 06/26/2012.  No report of vaginal bleeding.  +abnormal vaginal discharge, clear mucus-like discharge.  +fluttering felt for first time last week.      Past Medical History  Diagnosis Date  . Asthma   . Obesity     Past Surgical History  Procedure Laterality Date  . Ankle arthroplasty      No family history on file.  History  Substance Use Topics  . Smoking status: Current Every Day Smoker  . Smokeless tobacco: Not on file  . Alcohol Use: No    Allergies:  Allergies  Allergen Reactions  . Penicillins     Prescriptions prior to admission  Medication Sig Dispense Refill  . ibuprofen (ADVIL,MOTRIN) 200 MG tablet Take 400 mg by mouth every 6 (six) hours as needed. For pain         Review of Systems  Gastrointestinal: Positive for abdominal pain (cramping).  Genitourinary:       Clear vaginal discharge  All other systems reviewed and are negative.   Physical Exam   Blood pressure 121/65, pulse 91, temperature 98.3 F (36.8 C), temperature source Oral, resp. rate 20, height 5\' 3"  (1.6 m), weight 122.743 kg (270 lb 9.6 oz), last menstrual period 06/26/2012, SpO2 100.00%.  Physical Exam  Constitutional: She is oriented to person, place, and time. She appears well-developed and well-nourished. No distress.  HENT:  Head: Normocephalic.  Neck: Normal range of motion. Neck supple.  Cardiovascular: Normal rate, regular rhythm and normal heart sounds.   Respiratory: Effort normal and breath sounds normal.  GI: Soft. There is no tenderness.  Genitourinary: No bleeding around the  vagina. Vaginal discharge (mucusy) found.  Fishy odor; cervix closed  Neurological: She is alert and oriented to person, place, and time.  Skin: Skin is warm and dry.    MAU Course  Procedures  Results for orders placed during the hospital encounter of 11/02/12 (from the past 24 hour(s))  URINALYSIS, ROUTINE W REFLEX MICROSCOPIC     Status: None   Collection Time    11/02/12  7:00 PM      Result Value Range   Color, Urine YELLOW  YELLOW   APPearance CLEAR  CLEAR   Specific Gravity, Urine 1.025  1.005 - 1.030   pH 6.0  5.0 - 8.0   Glucose, UA NEGATIVE  NEGATIVE mg/dL   Hgb urine dipstick NEGATIVE  NEGATIVE   Bilirubin Urine NEGATIVE  NEGATIVE   Ketones, ur NEGATIVE  NEGATIVE mg/dL   Protein, ur NEGATIVE  NEGATIVE mg/dL   Urobilinogen, UA 0.2  0.0 - 1.0 mg/dL   Nitrite NEGATIVE  NEGATIVE   Leukocytes, UA NEGATIVE  NEGATIVE  POCT PREGNANCY, URINE     Status: Abnormal   Collection Time    11/02/12  7:07 PM      Result Value Range   Preg Test, Ur POSITIVE (*) NEGATIVE  WET PREP, GENITAL     Status: Abnormal   Collection Time    11/02/12  7:38 PM      Result Value Range   Yeast  Wet Prep HPF POC NONE SEEN  NONE SEEN   Trich, Wet Prep NONE SEEN  NONE SEEN   Clue Cells Wet Prep HPF POC NONE SEEN  NONE SEEN   WBC, Wet Prep HPF POC RARE (*) NONE SEEN    Assessment and Plan  Abdominal Pain in Pregnancy  Plan: Dischage to home Provided reassurance Keep scheduled prenatal care appointment  [facility] 11/02/2012, 7:30 PM

## 2012-11-02 NOTE — MAU Note (Signed)
Patient states she has had a positive home pregnancy test about 3 months ago. Has first appointment on 3-20 at [facility]. States she started having abdominal pain and cramping on 3-12. Denies bleeding but thinks she might have a vaginal discharge.

## 2012-11-08 ENCOUNTER — Ambulatory Visit (INDEPENDENT_AMBULATORY_CARE_PROVIDER_SITE_OTHER): Payer: Medicaid Other | Admitting: Obstetrics

## 2012-11-08 ENCOUNTER — Encounter: Payer: Self-pay | Admitting: Obstetrics

## 2012-11-08 VITALS — BP 130/82 | Temp 98.5°F | Wt 274.0 lb

## 2012-11-08 DIAGNOSIS — Z3491 Encounter for supervision of normal pregnancy, unspecified, first trimester: Secondary | ICD-10-CM

## 2012-11-08 DIAGNOSIS — Z3481 Encounter for supervision of other normal pregnancy, first trimester: Secondary | ICD-10-CM

## 2012-11-08 DIAGNOSIS — Z3480 Encounter for supervision of other normal pregnancy, unspecified trimester: Secondary | ICD-10-CM

## 2012-11-08 LAB — POCT URINALYSIS DIPSTICK
Bilirubin, UA: NEGATIVE
Blood, UA: NEGATIVE
Glucose, UA: NEGATIVE
Spec Grav, UA: 1.025

## 2012-11-08 MED ORDER — ONDANSETRON 4 MG PO TBDP: 8.0000 mg | ORAL_TABLET | Freq: Three times a day (TID) | ORAL | Status: DC | PRN

## 2012-11-08 MED ORDER — OB COMPLETE PETITE 35-5-1-200 MG PO CAPS: 1.0000 | ORAL_CAPSULE | Freq: Every morning | ORAL | Status: DC

## 2012-11-08 NOTE — Progress Notes (Signed)
C/O nausea, HA.

## 2012-11-08 NOTE — Progress Notes (Signed)
Pt states she is having sharp pain located under her lower abd. Pt states she has been feeling flutters for about 2 weeks.

## 2012-11-09 LAB — OBSTETRIC PANEL
Eosinophils Absolute: 0.1 10*3/uL (ref 0.0–0.7)
Eosinophils Relative: 1 % (ref 0–5)
Hemoglobin: 12.2 g/dL (ref 12.0–15.0)
Hepatitis B Surface Ag: NEGATIVE
Lymphocytes Relative: 18 % (ref 12–46)
Lymphs Abs: 2.5 10*3/uL (ref 0.7–4.0)
MCH: 21.3 pg — ABNORMAL LOW (ref 26.0–34.0)
MCV: 65.8 fL — ABNORMAL LOW (ref 78.0–100.0)
Monocytes Relative: 6 % (ref 3–12)
Neutrophils Relative %: 75 % (ref 43–77)
RBC: 5.73 MIL/uL — ABNORMAL HIGH (ref 3.87–5.11)
Rh Type: POSITIVE
Rubella: 1.36 Index — ABNORMAL HIGH (ref <0.90)
WBC: 14.2 10*3/uL — ABNORMAL HIGH (ref 4.0–10.5)

## 2012-11-09 LAB — VITAMIN D 25 HYDROXY (VIT D DEFICIENCY, FRACTURES): Vit D, 25-Hydroxy: 18 ng/mL — ABNORMAL LOW (ref 30–89)

## 2012-11-10 LAB — URINE CULTURE: Colony Count: >=100000

## 2012-11-12 LAB — HEMOGLOBINOPATHY EVALUATION
Hemoglobin Other: 0 %
Hgb A: 68.9 % — ABNORMAL LOW (ref 96.8–97.8)
Hgb S Quant: 27 % — ABNORMAL HIGH

## 2012-11-20 ENCOUNTER — Telehealth: Payer: Self-pay | Admitting: *Deleted

## 2012-11-20 NOTE — Telephone Encounter (Signed)
Pt is allergic to PCN, will pend note and review with Dr. Clearance Coots.

## 2012-11-20 NOTE — Telephone Encounter (Signed)
Message copied by Julaine Hua on Tue Nov 20, 2012  5:36 PM ------      Message from: Brock Bad      Created: Fri Nov 16, 2012  5:05 AM                   ----- Message -----         From: Brock Bad, MD         Sent: 11/14/2012   5:42 AM           To: Shelda Pal, LPN            Notify patient of positive Sickle Cell Trait.      Ceftin 500 mg p o bid x 7 days.      Vitamin D 1000 mg p o daily. ------

## 2012-11-21 MED ORDER — SULFAMETHOXAZOLE-TRIMETHOPRIM 800-160 MG PO TABS: 1.0000 | ORAL_TABLET | Freq: Two times a day (BID) | ORAL | Status: DC

## 2012-11-21 NOTE — Telephone Encounter (Signed)
Per Dr. Clearance Coots Bactrim DS sent to pt's pharmacy.

## 2012-11-26 ENCOUNTER — Encounter: Payer: Medicaid Other | Admitting: Obstetrics

## 2012-12-20 ENCOUNTER — Ambulatory Visit (INDEPENDENT_AMBULATORY_CARE_PROVIDER_SITE_OTHER): Payer: Medicaid Other | Admitting: Obstetrics

## 2012-12-20 ENCOUNTER — Encounter: Payer: Self-pay | Admitting: Obstetrics

## 2012-12-20 VITALS — BP 124/80 | Temp 98.4°F | Wt 269.0 lb

## 2012-12-20 DIAGNOSIS — J302 Other seasonal allergic rhinitis: Secondary | ICD-10-CM | POA: Insufficient documentation

## 2012-12-20 DIAGNOSIS — Z3480 Encounter for supervision of other normal pregnancy, unspecified trimester: Secondary | ICD-10-CM

## 2012-12-20 DIAGNOSIS — Z3482 Encounter for supervision of other normal pregnancy, second trimester: Secondary | ICD-10-CM

## 2012-12-20 DIAGNOSIS — Z369 Encounter for antenatal screening, unspecified: Secondary | ICD-10-CM

## 2012-12-20 DIAGNOSIS — J309 Allergic rhinitis, unspecified: Secondary | ICD-10-CM

## 2012-12-20 LAB — POCT URINALYSIS DIPSTICK
Bilirubin, UA: NEGATIVE
Blood, UA: NEGATIVE
Glucose, UA: NEGATIVE
Ketones, UA: NEGATIVE
Spec Grav, UA: >=1.03

## 2012-12-20 MED ORDER — LORATADINE 10 MG PO TABS: 10.0000 mg | ORAL_TABLET | Freq: Every day | ORAL | Status: DC

## 2012-12-20 NOTE — Progress Notes (Signed)
Pulse: 103

## 2012-12-21 LAB — AFP, QUAD SCREEN
Age Alone: 1:887 {titer}
Down Syndrome Scr Risk Est: <1:38500 {titer}
HCG, Total: 8775 m[IU]/mL
Interpretation-AFP: NEGATIVE
MoM for INH: 1.17
MoM for hCG: 0.55
Open Spina bifida: NEGATIVE
Osb Risk: 1:2590 {titer}
Tri 18 Scr Risk Est: NEGATIVE
uE3 Mom: 1.27

## 2013-01-02 ENCOUNTER — Emergency Department: Payer: Self-pay | Admitting: Internal Medicine

## 2013-01-02 LAB — CBC
HCT: 34.4 % — ABNORMAL LOW (ref 35.0–47.0)
MCV: 66 fL — ABNORMAL LOW (ref 80–100)
Platelet: 239 10*3/uL (ref 150–440)
WBC: 12.6 10*3/uL — ABNORMAL HIGH (ref 3.6–11.0)

## 2013-01-02 LAB — BASIC METABOLIC PANEL
Anion Gap: 10 (ref 7–16)
BUN: 5 mg/dL — ABNORMAL LOW (ref 7–18)
Creatinine: 0.81 mg/dL (ref 0.60–1.30)
EGFR (Non-African Amer.): >60
Potassium: 3.4 mmol/L — ABNORMAL LOW (ref 3.5–5.1)
Sodium: 138 mmol/L (ref 136–145)

## 2013-01-22 ENCOUNTER — Ambulatory Visit (INDEPENDENT_AMBULATORY_CARE_PROVIDER_SITE_OTHER): Payer: Medicaid Other | Admitting: Obstetrics

## 2013-01-22 ENCOUNTER — Ambulatory Visit (INDEPENDENT_AMBULATORY_CARE_PROVIDER_SITE_OTHER): Payer: Medicaid Other

## 2013-01-22 DIAGNOSIS — Z3480 Encounter for supervision of other normal pregnancy, unspecified trimester: Secondary | ICD-10-CM

## 2013-01-22 DIAGNOSIS — Z369 Encounter for antenatal screening, unspecified: Secondary | ICD-10-CM

## 2013-01-22 DIAGNOSIS — Z3482 Encounter for supervision of other normal pregnancy, second trimester: Secondary | ICD-10-CM

## 2013-01-22 LAB — US OB DETAIL + 14 WK

## 2013-01-23 ENCOUNTER — Encounter: Payer: Self-pay | Admitting: Obstetrics

## 2013-02-20 ENCOUNTER — Ambulatory Visit (INDEPENDENT_AMBULATORY_CARE_PROVIDER_SITE_OTHER): Payer: Medicaid Other | Admitting: Obstetrics

## 2013-02-20 ENCOUNTER — Encounter: Payer: Self-pay | Admitting: Obstetrics

## 2013-02-20 VITALS — BP 110/65 | Temp 98.6°F | Wt 272.0 lb

## 2013-02-20 DIAGNOSIS — Z3480 Encounter for supervision of other normal pregnancy, unspecified trimester: Secondary | ICD-10-CM

## 2013-02-20 DIAGNOSIS — Z3482 Encounter for supervision of other normal pregnancy, second trimester: Secondary | ICD-10-CM

## 2013-02-20 LAB — POCT URINALYSIS DIPSTICK
Glucose, UA: NEGATIVE
Ketones, UA: NEGATIVE
Leukocytes, UA: NEGATIVE
Spec Grav, UA: 1.025

## 2013-02-20 NOTE — Progress Notes (Signed)
P 98 Patient reports a clear "gooey" type discharge- no itching or irritation. Patient reports some pressure when up and also when laying or sitting on the toilet.

## 2013-02-25 ENCOUNTER — Inpatient Hospital Stay (HOSPITAL_COMMUNITY)
Admission: AD | Admit: 2013-02-25 | Discharge: 2013-02-25 | Disposition: A | Discharge status: Home / Self Care | Payer: Medicaid Other | Source: Ambulatory Visit | Attending: Obstetrics & Gynecology | Admitting: Obstetrics & Gynecology

## 2013-02-25 ENCOUNTER — Encounter (HOSPITAL_COMMUNITY): Payer: Self-pay | Admitting: *Deleted

## 2013-02-25 DIAGNOSIS — M545 Low back pain, unspecified: Secondary | ICD-10-CM | POA: Insufficient documentation

## 2013-02-25 DIAGNOSIS — R109 Unspecified abdominal pain: Secondary | ICD-10-CM | POA: Insufficient documentation

## 2013-02-25 DIAGNOSIS — N949 Unspecified condition associated with female genital organs and menstrual cycle: Secondary | ICD-10-CM

## 2013-02-25 DIAGNOSIS — O99891 Other specified diseases and conditions complicating pregnancy: Secondary | ICD-10-CM | POA: Insufficient documentation

## 2013-02-25 LAB — URINALYSIS, ROUTINE W REFLEX MICROSCOPIC
Bilirubin Urine: NEGATIVE
Hgb urine dipstick: NEGATIVE
Ketones, ur: NEGATIVE mg/dL
Nitrite: NEGATIVE
Specific Gravity, Urine: >1.03 — ABNORMAL HIGH (ref 1.005–1.030)
pH: 5.5 (ref 5.0–8.0)

## 2013-02-25 MED ORDER — OXYCODONE-ACETAMINOPHEN 5-325 MG PO TABS: 1.0000 | ORAL_TABLET | Freq: Four times a day (QID) | ORAL | Status: DC | PRN

## 2013-02-25 NOTE — MAU Note (Signed)
C/o sharp pains since 0200 this Am; pain starts both flanks and "runs down to in my cervix"; pain is intermittent and is worse when she ambulates;

## 2013-02-25 NOTE — MAU Note (Signed)
CC:   Abdominal pain   HPI:   26yo [id number] at 109w4d pt of Dr Clearance Coots with no significant past medical history presenting with bilateral flank pain that radiates to inguinal area.  Pain woke her up at night and is worse when she ambulates.   Has tried Tylenol but has not helped.  States she had a fall last week when she was coming outside of the tub but otherwise no recent trauma.  She has some urinary frequency and reports good fetal movement, but denies LOF, vaginal bleeding, vaginal itching/burning, other urinary symptoms, h/a, dizziness, n/v, or fever/chills.     No current facility-administered medications on file prior to encounter.   Current Outpatient Prescriptions on File Prior to Encounter  Medication Sig Dispense Refill  . Prenat-FeCbn-FeAspGl-FA-Omega (OB COMPLETE PETITE) 35-5-1-200 MG CAPS Take 1 capsule by mouth every morning.  90 capsule  3  . albuterol (PROVENTIL HFA;VENTOLIN HFA) 108 (90 BASE) MCG/ACT inhaler Inhale 2 puffs into the lungs every 6 (six) hours as needed for wheezing.      . ondansetron (ZOFRAN ODT) 4 MG disintegrating tablet Take 2 tablets (8 mg total) by mouth every 8 (eight) hours as needed for nausea.  20 tablet  5   Allergies  Allergen Reactions  . Penicillins Hives, Shortness Of Breath, Itching and Swelling    Throat swelling   Past Medical History  Diagnosis Date  . Asthma   . Obesity    Past Surgical History  Procedure Laterality Date  . Ankle arthroplasty     History   Social History  . Marital Status: Single    Spouse Name: N/A    Number of Children: N/A  . Years of Education: N/A   Occupational History  . Not on file.   Social History Main Topics  . Smoking status: Light Tobacco Smoker -- 0.25 packs/day  . Smokeless tobacco: Not on file  . Alcohol Use: No  . Drug Use: No  . Sexually Active: Yes    Birth Control/ Protection: None   Other Topics Concern  . Not on file   Social History Narrative  . No narrative on file     Physical Exam: Patient Vitals for the past 24 hrs:  BP Temp Temp src Pulse Resp Height Weight  02/25/13 1447 121/66 mmHg - - 97 18 - -  02/25/13 1426 118/60 mmHg 97.9 F (36.6 C) Oral 109 18 5' 1.5" (1.562 m) 121.11 kg (267 lb)    Constitutional:  Alert, under no acute distress Cardiovascular:  RRR Lungs:  No CTAB Abdominal:  + bowel sounds, Mild lower abdominal pain, no rebound tenderness, no muscular rigidity  Dilation: Closed Effacement (%): Thick Cervical Position: Posterior Station:  (high) Exam by:: L Leftwich-Kirby CNM  Results for orders placed during the hospital encounter of 02/25/13 (from the past 24 hour(s))  URINALYSIS, ROUTINE W REFLEX MICROSCOPIC     Status: Abnormal   Collection Time    02/25/13  2:40 PM      Result Value Range   Color, Urine YELLOW  YELLOW   APPearance HAZY (*) CLEAR   Specific Gravity, Urine >1.030 (*) 1.005 - 1.030   pH 5.5  5.0 - 8.0   Glucose, UA NEGATIVE  NEGATIVE mg/dL   Hgb urine dipstick NEGATIVE  NEGATIVE   Bilirubin Urine NEGATIVE  NEGATIVE   Ketones, ur NEGATIVE  NEGATIVE mg/dL   Protein, ur NEGATIVE  NEGATIVE mg/dL   Urobilinogen, UA 0.2  0.0 - 1.0 mg/dL  Nitrite NEGATIVE  NEGATIVE   Leukocytes, UA NEGATIVE  NEGATIVE     Assessment/Plan: 26yo G4P3003 at [redacted]w[redacted]d  1. Round ligament pain     D/C home F/U with prenatal provider Return to MAU as needed  I have seen this patient and agree with the above PA student's note.  LEFTWICH-KIRBY, Dannika Hilgeman Certified Nurse-Midwife

## 2013-02-25 NOTE — MAU Note (Signed)
Sharp pain in low back, radiates around to the front.  Started at 0200, unable to sleep.  No leaking or bleeding.

## 2013-03-06 ENCOUNTER — Other Ambulatory Visit: Payer: Medicaid Other

## 2013-03-06 ENCOUNTER — Encounter: Payer: Medicaid Other | Admitting: Obstetrics

## 2013-03-11 ENCOUNTER — Encounter: Payer: Self-pay | Admitting: Obstetrics

## 2013-03-11 LAB — US OB DETAIL + 14 WK

## 2013-03-12 ENCOUNTER — Other Ambulatory Visit: Payer: Medicaid Other

## 2013-03-12 ENCOUNTER — Encounter: Payer: Medicaid Other | Admitting: Obstetrics

## 2013-03-25 ENCOUNTER — Encounter: Payer: Self-pay | Admitting: *Deleted

## 2013-03-26 ENCOUNTER — Ambulatory Visit (INDEPENDENT_AMBULATORY_CARE_PROVIDER_SITE_OTHER): Payer: Medicaid Other | Admitting: Obstetrics

## 2013-03-26 ENCOUNTER — Encounter: Payer: Self-pay | Admitting: Obstetrics

## 2013-03-26 ENCOUNTER — Encounter: Payer: Self-pay | Admitting: *Deleted

## 2013-03-26 VITALS — BP 124/79 | Temp 98.2°F | Wt 273.2 lb

## 2013-03-26 DIAGNOSIS — Z3483 Encounter for supervision of other normal pregnancy, third trimester: Secondary | ICD-10-CM

## 2013-03-26 DIAGNOSIS — O3660X0 Maternal care for excessive fetal growth, unspecified trimester, not applicable or unspecified: Secondary | ICD-10-CM

## 2013-03-26 DIAGNOSIS — O3663X1 Maternal care for excessive fetal growth, third trimester, fetus 1: Secondary | ICD-10-CM

## 2013-03-26 DIAGNOSIS — Z3480 Encounter for supervision of other normal pregnancy, unspecified trimester: Secondary | ICD-10-CM

## 2013-03-26 LAB — POCT URINALYSIS DIPSTICK
Bilirubin, UA: NEGATIVE
Blood, UA: NEGATIVE
Color, UA: NEGATIVE
Nitrite, UA: NEGATIVE
Urobilinogen, UA: NEGATIVE
pH, UA: 5

## 2013-03-26 NOTE — Progress Notes (Signed)
Pulse- 96 

## 2013-04-04 ENCOUNTER — Emergency Department: Payer: Self-pay | Admitting: Emergency Medicine

## 2013-04-09 ENCOUNTER — Encounter: Payer: Self-pay | Admitting: Obstetrics

## 2013-04-09 ENCOUNTER — Ambulatory Visit (INDEPENDENT_AMBULATORY_CARE_PROVIDER_SITE_OTHER): Payer: Medicaid Other | Admitting: Obstetrics

## 2013-04-09 ENCOUNTER — Ambulatory Visit (INDEPENDENT_AMBULATORY_CARE_PROVIDER_SITE_OTHER): Payer: Medicaid Other

## 2013-04-09 VITALS — BP 129/76 | Temp 97.1°F | Wt 268.0 lb

## 2013-04-09 DIAGNOSIS — O3663X1 Maternal care for excessive fetal growth, third trimester, fetus 1: Secondary | ICD-10-CM

## 2013-04-09 DIAGNOSIS — O3660X0 Maternal care for excessive fetal growth, unspecified trimester, not applicable or unspecified: Secondary | ICD-10-CM

## 2013-04-09 DIAGNOSIS — Z3480 Encounter for supervision of other normal pregnancy, unspecified trimester: Secondary | ICD-10-CM

## 2013-04-09 DIAGNOSIS — L0291 Cutaneous abscess, unspecified: Secondary | ICD-10-CM

## 2013-04-09 DIAGNOSIS — M549 Dorsalgia, unspecified: Secondary | ICD-10-CM

## 2013-04-09 DIAGNOSIS — L0390 Cellulitis, unspecified: Secondary | ICD-10-CM

## 2013-04-09 DIAGNOSIS — Z3483 Encounter for supervision of other normal pregnancy, third trimester: Secondary | ICD-10-CM

## 2013-04-09 LAB — US OB DETAIL + 14 WK

## 2013-04-09 LAB — POCT URINALYSIS DIPSTICK
Blood, UA: NEGATIVE
Glucose, UA: NEGATIVE
Nitrite, UA: NEGATIVE
Spec Grav, UA: 1.02
Urobilinogen, UA: NEGATIVE
pH, UA: 5

## 2013-04-09 MED ORDER — HYDROCODONE-ACETAMINOPHEN 7.5-300 MG PO TABS: 1.0000 | ORAL_TABLET | Freq: Four times a day (QID) | ORAL | Status: DC | PRN

## 2013-04-09 MED ORDER — CLINDAMYCIN HCL 300 MG PO CAPS: 300.0000 mg | ORAL_CAPSULE | Freq: Three times a day (TID) | ORAL | Status: DC

## 2013-04-09 NOTE — Progress Notes (Signed)
pusle 89, patient c/o white d/c no odor x 4 days. No self care measures taken.

## 2013-04-09 NOTE — Addendum Note (Signed)
Addended by: George Hugh on: 04/09/2013 01:16 PM   Modules accepted: Orders

## 2013-04-10 LAB — GLUCOSE TOLERANCE, 2 HOURS W/ 1HR
Glucose, 1 hour: 149 mg/dL (ref 70–170)
Glucose, 2 hour: 130 mg/dL (ref 70–139)

## 2013-04-10 LAB — CBC
HCT: 32.4 % — ABNORMAL LOW (ref 36.0–46.0)
Hemoglobin: 10.6 g/dL — ABNORMAL LOW (ref 12.0–15.0)
MCHC: 32.7 g/dL (ref 30.0–36.0)
MCV: 65.5 fL — ABNORMAL LOW (ref 78.0–100.0)
RDW: 16.8 % — ABNORMAL HIGH (ref 11.5–15.5)

## 2013-04-10 LAB — RPR

## 2013-04-16 ENCOUNTER — Encounter: Payer: Medicaid Other | Admitting: Obstetrics

## 2013-05-02 ENCOUNTER — Encounter: Payer: Medicaid Other | Admitting: Obstetrics

## 2013-05-07 ENCOUNTER — Ambulatory Visit (INDEPENDENT_AMBULATORY_CARE_PROVIDER_SITE_OTHER): Payer: Medicaid Other | Admitting: Obstetrics

## 2013-05-07 VITALS — BP 123/84 | Temp 98.0°F | Wt 280.2 lb

## 2013-05-07 DIAGNOSIS — Z3480 Encounter for supervision of other normal pregnancy, unspecified trimester: Secondary | ICD-10-CM

## 2013-05-07 DIAGNOSIS — Z3483 Encounter for supervision of other normal pregnancy, third trimester: Secondary | ICD-10-CM

## 2013-05-07 LAB — POCT URINALYSIS DIPSTICK
Bilirubin, UA: NEGATIVE
Blood, UA: NEGATIVE
Ketones, UA: NEGATIVE
Protein, UA: NEGATIVE
pH, UA: 5

## 2013-05-07 NOTE — Progress Notes (Signed)
Pulse 69, patient is requesting a cervical check

## 2013-05-08 ENCOUNTER — Encounter: Payer: Self-pay | Admitting: Obstetrics

## 2013-05-14 ENCOUNTER — Encounter: Payer: Medicaid Other | Admitting: Obstetrics

## 2013-05-29 ENCOUNTER — Encounter: Payer: Medicaid Other | Admitting: Obstetrics

## 2013-05-30 ENCOUNTER — Inpatient Hospital Stay: Payer: Self-pay | Admitting: Obstetrics and Gynecology

## 2013-05-30 LAB — CBC WITH DIFFERENTIAL/PLATELET
Basophil %: 0.5 %
Eosinophil #: 0 10*3/uL (ref 0.0–0.7)
Eosinophil %: 0.1 %
HCT: 34.6 % — ABNORMAL LOW (ref 35.0–47.0)
HGB: 11.3 g/dL — ABNORMAL LOW (ref 12.0–16.0)
Lymphocyte #: 1.2 10*3/uL (ref 1.0–3.6)
MCV: 67 fL — ABNORMAL LOW (ref 80–100)
Monocyte %: 5.9 %
Neutrophil %: 84.1 %
RDW: 15.9 % — ABNORMAL HIGH (ref 11.5–14.5)
WBC: 12.8 10*3/uL — ABNORMAL HIGH (ref 3.6–11.0)

## 2013-05-31 LAB — HEMOGLOBIN: HGB: 9.3 g/dL — ABNORMAL LOW (ref 12.0–16.0)

## 2013-08-13 ENCOUNTER — Emergency Department: Payer: Self-pay | Admitting: Emergency Medicine

## 2013-09-07 ENCOUNTER — Encounter (HOSPITAL_COMMUNITY): Payer: Self-pay | Admitting: *Deleted

## 2013-10-06 ENCOUNTER — Emergency Department: Payer: Self-pay | Admitting: Emergency Medicine

## 2013-10-06 LAB — URINALYSIS, COMPLETE
BLOOD: NEGATIVE
Bilirubin,UR: NEGATIVE
Glucose,UR: NEGATIVE mg/dL (ref 0–75)
Ketone: NEGATIVE
LEUKOCYTE ESTERASE: NEGATIVE
Nitrite: NEGATIVE
PH: 5 (ref 4.5–8.0)
Protein: NEGATIVE
SPECIFIC GRAVITY: 1.024 (ref 1.003–1.030)
Squamous Epithelial: <1
WBC UR: 2 /HPF (ref 0–5)

## 2013-10-06 LAB — WET PREP, GENITAL

## 2013-10-06 LAB — GC/CHLAMYDIA PROBE AMP

## 2014-06-23 ENCOUNTER — Encounter (HOSPITAL_COMMUNITY): Payer: Self-pay | Admitting: *Deleted

## 2014-10-28 ENCOUNTER — Emergency Department: Payer: Self-pay | Admitting: Emergency Medicine

## 2015-04-01 ENCOUNTER — Emergency Department
Admission: EM | Admit: 2015-04-01 | Discharge: 2015-04-01 | Disposition: A | Discharge status: Home / Self Care | Payer: Medicaid Other | Attending: Emergency Medicine | Admitting: Emergency Medicine

## 2015-04-01 ENCOUNTER — Encounter: Payer: Self-pay | Admitting: *Deleted

## 2015-04-01 DIAGNOSIS — B349 Viral infection, unspecified: Secondary | ICD-10-CM | POA: Diagnosis not present

## 2015-04-01 DIAGNOSIS — L02412 Cutaneous abscess of left axilla: Secondary | ICD-10-CM | POA: Diagnosis not present

## 2015-04-01 DIAGNOSIS — Z720 Tobacco use: Secondary | ICD-10-CM | POA: Diagnosis not present

## 2015-04-01 DIAGNOSIS — Z79899 Other long term (current) drug therapy: Secondary | ICD-10-CM | POA: Diagnosis not present

## 2015-04-01 DIAGNOSIS — Z880 Allergy status to penicillin: Secondary | ICD-10-CM | POA: Diagnosis not present

## 2015-04-01 DIAGNOSIS — R05 Cough: Secondary | ICD-10-CM | POA: Diagnosis present

## 2015-04-01 NOTE — ED Notes (Signed)
Pt reports abdominal cramping, vomiting, headache since last night with chills.

## 2015-04-01 NOTE — Discharge Instructions (Signed)
You have been seen in the Emergency Department (ED) today for a likely viral illness.  Please drink plenty of clear fluids (water, Gatorade, chicken broth, etc).  You may use Tylenol and/or Motrin according to label instructions.  You can alternate between the two without any side effects.   Please follow up with your doctor as listed above.  Call your doctor or return to the Emergency Department (ED) if you are unable to tolerate fluids due to vomiting, have worsening trouble breathing, become extremely tired or difficult to awaken, or if you develop any other symptoms that concern you.  Additionally, since your abscess in her left armpit is draining well, there is no additional treatment needed at this time.  However, if he gets worse, starts to swell or turn red, or if you develop new symptoms that concern you, please return to the emergency department or to an urgent care clinic for reevaluation.   Viral Infections A viral infection can be caused by different types of viruses.Most viral infections are not serious and resolve on their own. However, some infections may cause severe symptoms and may lead to further complications. SYMPTOMS Viruses can frequently cause:  Minor sore throat.  Aches and pains.  Headaches.  Runny nose.  Different types of rashes.  Watery eyes.  Tiredness.  Cough.  Loss of appetite.  Gastrointestinal infections, resulting in nausea, vomiting, and diarrhea. These symptoms do not respond to antibiotics because the infection is not caused by bacteria. However, you might catch a bacterial infection following the viral infection. This is sometimes called a "superinfection." Symptoms of such a bacterial infection may include:  Worsening sore throat with pus and difficulty swallowing.  Swollen neck glands.  Chills and a high or persistent fever.  Severe headache.  Tenderness over the sinuses.  Persistent overall ill feeling (malaise), muscle aches,  and tiredness (fatigue).  Persistent cough.  Yellow, green, or brown mucus production with coughing. HOME CARE INSTRUCTIONS   Only take over-the-counter or prescription medicines for pain, discomfort, diarrhea, or fever as directed by your caregiver.  Drink enough water and fluids to keep your urine clear or pale yellow. Sports drinks can provide valuable electrolytes, sugars, and hydration.  Get plenty of rest and maintain proper nutrition. Soups and broths with crackers or rice are fine. SEEK IMMEDIATE MEDICAL CARE IF:   You have severe headaches, shortness of breath, chest pain, neck pain, or an unusual rash.  You have uncontrolled vomiting, diarrhea, or you are unable to keep down fluids.  You or your child has an oral temperature above 102 F (38.9 C), not controlled by medicine.  Your baby is older than 3 months with a rectal temperature of 102 F (38.9 C) or higher.  Your baby is 110 months old or younger with a rectal temperature of 100.4 F (38 C) or higher. MAKE SURE YOU:   Understand these instructions.  Will watch your condition.  Will get help right away if you are not doing well or get worse. Document Released: 05/18/2005 Document Revised: 10/31/2011 Document Reviewed: 12/13/2010 [facility] Patient Information 2015 [place], [place]. This information is not intended to replace advice given to you by your health care provider. Make sure you discuss any questions you have with your health care provider.  Abscess An abscess (boil or furuncle) is an infected area on or under the skin. This area is filled with yellowish-white fluid (pus) and other material (debris). HOME CARE   Only take medicines as told by your doctor.  If you were given antibiotic medicine, take it as directed. Finish the medicine even if you start to feel better.  If gauze is used, follow your doctor's directions for changing the gauze.  To avoid spreading the infection:  Keep your abscess  covered with a bandage.  Wash your hands well.  Do not share personal care items, towels, or whirlpools with others.  Avoid skin contact with others.  Keep your skin and clothes clean around the abscess.  Keep all doctor visits as told. GET HELP RIGHT AWAY IF:   You have more pain, puffiness (swelling), or redness in the wound site.  You have more fluid or blood coming from the wound site.  You have muscle aches, chills, or you feel sick.  You have a fever. MAKE SURE YOU:   Understand these instructions.  Will watch your condition.  Will get help right away if you are not doing well or get worse. Document Released: 01/25/2008 Document Revised: 02/07/2012 Document Reviewed: 10/21/2011 [facility] Patient Information 2015 [place], [place]. This information is not intended to replace advice given to you by your health care provider. Make sure you discuss any questions you have with your health care provider.

## 2015-04-01 NOTE — ED Provider Notes (Signed)
[facility] Emergency Department Provider Note  ____________________________________________  Time seen: Approximately 4:59 PM  I have reviewed the triage vital signs and the nursing notes.   HISTORY  Chief Complaint Chills and Cough    HPI Donna Sanders is a 29 y.o. female with a history of obesity and mild asthma who presents with subjective fever/chills, mild headache, nausea, and generalized body aches since yesterday. She reports that the symptoms started last night and persisted today.  Overall the symptoms are moderate in severity.  She also reports a mild sore throat.She had some cramping abdominal pain yesterday, but it resolved.  She is currently on her period.   Past Medical History  Diagnosis Date  . Asthma   . Obesity     Patient Active Problem List   Diagnosis Date Noted  . Abscess 04/09/2013  . Backache 04/09/2013  . Excessive fetal growth affecting management of mother, antepartum 03/26/2013  . Allergic rhinitis, seasonal 12/20/2012    Past Surgical History  Procedure Laterality Date  . Ankle arthroplasty      Current Outpatient Rx  Name  Route  Sig  Dispense  Refill  . albuterol (PROVENTIL HFA;VENTOLIN HFA) 108 (90 BASE) MCG/ACT inhaler   Inhalation   Inhale 2 puffs into the lungs every 6 (six) hours as needed for wheezing.         . clindamycin (CLEOCIN) 300 MG capsule   Oral   Take 1 capsule (300 mg total) by mouth 3 (three) times daily.   30 capsule   1   . Hydrocodone-Acetaminophen 7.5-300 MG TABS   Oral   Take 1 tablet by mouth every 6 (six) hours as needed.   40 each   2   . ondansetron (ZOFRAN ODT) 4 MG disintegrating tablet   Oral   Take 2 tablets (8 mg total) by mouth every 8 (eight) hours as needed for nausea.   20 tablet   5   . Prenat-FeCbn-FeAspGl-FA-Omega (OB COMPLETE PETITE) 35-5-1-200 MG CAPS   Oral   Take 1 capsule by mouth every morning.   90 capsule   3      Allergies Penicillins  Family History  Problem Relation Age of Onset  . Asthma Mother   . Cancer Mother   . Depression Mother   . Migraines Mother     Social History Social History  Substance Use Topics  . Smoking status: Light Tobacco Smoker -- 0.25 packs/day  . Smokeless tobacco: None  . Alcohol Use: No    Review of Systems Constitutional: Subjective fever/chills Eyes: No visual changes. ENT: No sore throat. Cardiovascular: Denies chest pain. Respiratory: Denies shortness of breath. Gastrointestinal: resolved cramping abdominal pain.  nausea, no vomiting.  No diarrhea.  No constipation. Genitourinary: Negative for dysuria. Musculoskeletal: Negative for back pain. Skin: Negative for rash. Neurological: Negative for headaches, focal weakness or numbness.  10-point ROS otherwise negative.  ____________________________________________   PHYSICAL EXAM:  VITAL SIGNS: ED Triage Vitals  Enc Vitals Group     BP 04/01/15 1542 110/61 mmHg     Pulse Rate 04/01/15 1542 90     Resp 04/01/15 1542 16     Temp 04/01/15 1542 98.9 F (37.2 C)     Temp src --      SpO2 04/01/15 1542 95 %     Weight --      Height 04/01/15 1542  (1.575 m)     Head Cir --      Peak Flow --  Pain Score 04/01/15 1541 9     Pain Loc --      Pain Edu? --      Excl. in GC? --     Constitutional: Alert and oriented. Well appearing and in no acute distress. Eyes: Conjunctivae are normal. PERRL. EOMI. Head: Atraumatic. Nose: No congestion/rhinorrhea. Mouth/Throat: Mucous membranes are moist.  Oropharynx non-erythematous. No purulence of tonsils, no evidence of abscess(es) Neck: No stridor.   Hematological/Lymphatic/Immunological: No cervical lymphadenopathy. Cardiovascular: Normal rate, regular rhythm. Grossly normal heart sounds.  Good peripheral circulation. Respiratory: Normal respiratory effort.  No retractions. Lungs CTAB. Gastrointestinal: Soft and nontender. No distention.  No abdominal bruits. No CVA tenderness. Musculoskeletal: No lower extremity tenderness nor edema.  No joint effusions. Open, draining abscess in left axilla, no surrounding fluctuance nor cellulitis Neurologic:  Normal speech and language. No gross focal neurologic deficits are appreciated.  Skin:  Skin is warm, dry and intact. No rash noted. Psychiatric: Mood and affect are normal. Speech and behavior are normal.  ____________________________________________   LABS (all labs ordered are listed, but only abnormal results are displayed)  Not indicated ____________________________________________  EKG  Not indicated ____________________________________________  RADIOLOGY  Not indicated  ____________________________________________   PROCEDURES  Procedure(s) performed: None  Critical Care performed: No ____________________________________________   INITIAL IMPRESSION / ASSESSMENT AND PLAN / ED COURSE  Pertinent labs & imaging results that were available during my care of the patient were reviewed by me and considered in my medical decision making (see chart for details).  Well-appearing, no acute distress, vital signs are stable and afebrile.  The patient's signs and symptoms and history are most consistent with a viral illness.  I discussed with her that we could obtain some labs but they are unlikely to change the management, and she prefers not to have her blood drawn at this time given my explanation.  I gave her my usual and customary medications and return precautions and she is comfortable going home with a work note at this time.  Her left axillary abscess is open and draining with no surrounding fluctuance, induration, nor cellulitis.  She does not need additional treatment at this time. ____________________________________________  FINAL CLINICAL IMPRESSION(S) / ED DIAGNOSES  Final diagnoses:  Viral syndrome  Abscess of axilla, left      NEW MEDICATIONS  STARTED DURING THIS VISIT:  New Prescriptions   No medications on file     Loleta Rose, MD 04/01/15 1719

## 2015-06-11 ENCOUNTER — Emergency Department
Admission: EM | Admit: 2015-06-11 | Discharge: 2015-06-11 | Disposition: A | Discharge status: Home / Self Care | Payer: No Typology Code available for payment source | Attending: Student | Admitting: Student

## 2015-06-11 ENCOUNTER — Encounter: Payer: Self-pay | Admitting: *Deleted

## 2015-06-11 DIAGNOSIS — Z720 Tobacco use: Secondary | ICD-10-CM | POA: Insufficient documentation

## 2015-06-11 DIAGNOSIS — S199XXA Unspecified injury of neck, initial encounter: Secondary | ICD-10-CM | POA: Diagnosis present

## 2015-06-11 DIAGNOSIS — Y9389 Activity, other specified: Secondary | ICD-10-CM | POA: Insufficient documentation

## 2015-06-11 DIAGNOSIS — S299XXA Unspecified injury of thorax, initial encounter: Secondary | ICD-10-CM | POA: Diagnosis not present

## 2015-06-11 DIAGNOSIS — Z792 Long term (current) use of antibiotics: Secondary | ICD-10-CM | POA: Diagnosis not present

## 2015-06-11 DIAGNOSIS — Y998 Other external cause status: Secondary | ICD-10-CM | POA: Insufficient documentation

## 2015-06-11 DIAGNOSIS — Y92410 Unspecified street and highway as the place of occurrence of the external cause: Secondary | ICD-10-CM | POA: Insufficient documentation

## 2015-06-11 DIAGNOSIS — M25572 Pain in left ankle and joints of left foot: Secondary | ICD-10-CM

## 2015-06-11 DIAGNOSIS — Z79899 Other long term (current) drug therapy: Secondary | ICD-10-CM | POA: Diagnosis not present

## 2015-06-11 DIAGNOSIS — S161XXA Strain of muscle, fascia and tendon at neck level, initial encounter: Secondary | ICD-10-CM | POA: Insufficient documentation

## 2015-06-11 DIAGNOSIS — Z880 Allergy status to penicillin: Secondary | ICD-10-CM | POA: Diagnosis not present

## 2015-06-11 DIAGNOSIS — S99912A Unspecified injury of left ankle, initial encounter: Secondary | ICD-10-CM | POA: Diagnosis not present

## 2015-06-11 MED ORDER — OXYCODONE-ACETAMINOPHEN 5-325 MG PO TABS: 1.0000 | ORAL_TABLET | Freq: Four times a day (QID) | ORAL | Status: DC | PRN

## 2015-06-11 MED ORDER — KETOROLAC TROMETHAMINE 60 MG/2ML IM SOLN
60.0000 mg | Freq: Once | INTRAMUSCULAR | Status: AC
  Administered 2015-06-11: 60 mg via INTRAMUSCULAR
  Filled 2015-06-11: qty 2

## 2015-06-11 MED ORDER — ORPHENADRINE CITRATE 30 MG/ML IJ SOLN
60.0000 mg | Freq: Two times a day (BID) | INTRAMUSCULAR | Status: DC
  Administered 2015-06-11: 60 mg via INTRAMUSCULAR
  Filled 2015-06-11: qty 2

## 2015-06-11 MED ORDER — TRAMADOL HCL 50 MG PO TABS
50.0000 mg | ORAL_TABLET | Freq: Once | ORAL | Status: AC
  Administered 2015-06-11: 50 mg via ORAL
  Filled 2015-06-11: qty 1

## 2015-06-11 MED ORDER — IBUPROFEN 800 MG PO TABS: 800.0000 mg | ORAL_TABLET | Freq: Three times a day (TID) | ORAL | Status: DC | PRN

## 2015-06-11 MED ORDER — CYCLOBENZAPRINE HCL 10 MG PO TABS: 10.0000 mg | ORAL_TABLET | Freq: Three times a day (TID) | ORAL | Status: DC | PRN
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
## 2015-06-15 ENCOUNTER — Emergency Department: Payer: No Typology Code available for payment source

## 2015-06-15 ENCOUNTER — Encounter: Payer: Self-pay | Admitting: *Deleted

## 2015-06-15 ENCOUNTER — Emergency Department
Admission: EM | Admit: 2015-06-15 | Discharge: 2015-06-15 | Disposition: A | Discharge status: Home / Self Care | Payer: No Typology Code available for payment source | Attending: Emergency Medicine | Admitting: Emergency Medicine

## 2015-06-15 DIAGNOSIS — S29001S Unspecified injury of muscle and tendon of front wall of thorax, sequela: Secondary | ICD-10-CM | POA: Diagnosis not present

## 2015-06-15 DIAGNOSIS — Z720 Tobacco use: Secondary | ICD-10-CM | POA: Diagnosis not present

## 2015-06-15 DIAGNOSIS — R51 Headache: Secondary | ICD-10-CM | POA: Diagnosis not present

## 2015-06-15 DIAGNOSIS — Z79899 Other long term (current) drug therapy: Secondary | ICD-10-CM | POA: Diagnosis not present

## 2015-06-15 DIAGNOSIS — Z792 Long term (current) use of antibiotics: Secondary | ICD-10-CM | POA: Insufficient documentation

## 2015-06-15 DIAGNOSIS — M25512 Pain in left shoulder: Secondary | ICD-10-CM

## 2015-06-15 DIAGNOSIS — Z880 Allergy status to penicillin: Secondary | ICD-10-CM | POA: Diagnosis not present

## 2015-06-15 DIAGNOSIS — S299XXS Unspecified injury of thorax, sequela: Secondary | ICD-10-CM | POA: Diagnosis not present

## 2015-06-15 DIAGNOSIS — S4992XS Unspecified injury of left shoulder and upper arm, sequela: Secondary | ICD-10-CM | POA: Diagnosis not present

## 2015-06-15 DIAGNOSIS — R0789 Other chest pain: Secondary | ICD-10-CM

## 2015-06-15 MED ORDER — TRAMADOL HCL 50 MG PO TABS
100.0000 mg | ORAL_TABLET | Freq: Once | ORAL | Status: AC
  Administered 2015-06-15: 100 mg via ORAL
  Filled 2015-06-15: qty 2

## 2015-06-15 MED ORDER — IBUPROFEN 400 MG PO TABS
600.0000 mg | ORAL_TABLET | Freq: Once | ORAL | Status: AC
  Administered 2015-06-15: 600 mg via ORAL
  Filled 2015-06-15: qty 2

## 2015-06-15 NOTE — ED Notes (Signed)
Patient transported to X-ray 
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
## 2015-11-09 ENCOUNTER — Encounter: Payer: Self-pay | Admitting: Emergency Medicine

## 2015-11-09 ENCOUNTER — Emergency Department
Admission: EM | Admit: 2015-11-09 | Discharge: 2015-11-09 | Disposition: A | Discharge status: Home / Self Care | Payer: Worker's Compensation | Attending: Emergency Medicine | Admitting: Emergency Medicine

## 2015-11-09 DIAGNOSIS — Z792 Long term (current) use of antibiotics: Secondary | ICD-10-CM | POA: Insufficient documentation

## 2015-11-09 DIAGNOSIS — J45909 Unspecified asthma, uncomplicated: Secondary | ICD-10-CM | POA: Diagnosis not present

## 2015-11-09 DIAGNOSIS — Z79899 Other long term (current) drug therapy: Secondary | ICD-10-CM | POA: Diagnosis not present

## 2015-11-09 DIAGNOSIS — Z77098 Contact with and (suspected) exposure to other hazardous, chiefly nonmedicinal, chemicals: Secondary | ICD-10-CM | POA: Diagnosis present

## 2015-11-09 DIAGNOSIS — E669 Obesity, unspecified: Secondary | ICD-10-CM | POA: Diagnosis not present

## 2015-11-09 DIAGNOSIS — Z720 Tobacco use: Secondary | ICD-10-CM | POA: Insufficient documentation

## 2015-11-09 DIAGNOSIS — H109 Unspecified conjunctivitis: Secondary | ICD-10-CM | POA: Insufficient documentation

## 2015-11-09 DIAGNOSIS — H10213 Acute toxic conjunctivitis, bilateral: Secondary | ICD-10-CM

## 2015-11-09 MED ORDER — TETRACAINE HCL 0.5 % OP SOLN
2.0000 [drp] | Freq: Once | OPHTHALMIC | Status: AC
  Administered 2015-11-09: 2 [drp] via OPHTHALMIC
  Filled 2015-11-09: qty 2

## 2015-11-09 MED ORDER — TOBRAMYCIN 0.3 % OP SOLN: OPHTHALMIC | Status: DC

## 2015-11-09 MED ORDER — FLUORESCEIN SODIUM 1 MG OP STRP: 1.0000 | ORAL_STRIP | Freq: Once | OPHTHALMIC | Status: DC

## 2015-11-09 MED ORDER — FLUORESCEIN SODIUM 1 MG OP STRP
ORAL_STRIP | OPHTHALMIC | Status: AC
  Filled 2015-11-09: qty 2

## 2015-11-09 MED ORDER — HYDROCODONE-ACETAMINOPHEN 5-325 MG PO TABS: 1.0000 | ORAL_TABLET | ORAL | Status: DC | PRN

## 2015-11-09 MED ORDER — FLUORESCEIN-BENOXINATE 0.25-0.4 % OP SOLN: 1.0000 [drp] | Freq: Once | OPHTHALMIC | Status: DC
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
## 2015-11-09 NOTE — ED Notes (Signed)
Pt discharged home after verbalizing understanding of discharge instructions; nad noted. 

## 2015-11-09 NOTE — Discharge Instructions (Signed)
Chemical Conjunctivitis A thin, clear membrane (conjunctiva) covers the white part of your eye and the inner surface of your eyelid. The conjunctiva can become irritated by chemicals or other substances, such as smoke or chlorine. This is called chemical conjunctivitis. This condition can make your eye red, pink, and itchy. You may also have:  Watery eyes.  A burning feeling in your eyes.  Clear liquid from your eyes.  Swollen eyelids.  Sensitivity to light. You can get this this condition in one eye or both of your eyes. You cannot spread this condition to another person (noncontagious). HOME CARE  Take or apply medicines only as told by your doctor.  Apply a cool, clean washcloth to your eye for 10-20 minutes. Do this 3-4 times each day.  Do not touch or rub your eyes.  Do not wear contact lenses until the irritation is gone. Wear glasses instead.  Do not wear eye makeup until the irritation is gone.  Avoid being around the chemical or the environment that caused the irritation. Wear eye protection if you need to. GET HELP IF:  Your symptoms get worse.  You start to have pus draining from your eye.  You have new symptoms.  You have a fever.  You have a change in vision.  Your pain gets worse.   This information is not intended to replace advice given to you by your health care provider. Make sure you discuss any questions you have with your health care provider.   Document Released: 08/08/2005 Document Revised: 08/29/2014 Document Reviewed: 05/20/2014 Elsevier Interactive Patient Education 2016 [employer]   Call [facility] tomorrow morning if any continued problems. Begin using eyedrops today as directed. Norco if needed for severe pain. Do not take medication while working or driving. Use sunglasses with bright light.
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
## 2015-11-09 NOTE — ED Notes (Signed)
Pt taken to decon room.

## 2015-11-09 NOTE — ED Notes (Signed)
Pt taken to decon shower and deconed, pt clothing placed in biohazard bag [phone]#[id number], matching braclet placed on pts wrist. Pt given clean blue paper scrubs to put on and taken to room 49 for medical eval. RN and PA notified.
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
## 2015-12-09 ENCOUNTER — Encounter: Payer: Self-pay | Admitting: Emergency Medicine

## 2015-12-09 ENCOUNTER — Emergency Department
Admission: EM | Admit: 2015-12-09 | Discharge: 2015-12-09 | Disposition: A | Discharge status: Home / Self Care | Payer: Medicaid Other | Attending: Emergency Medicine | Admitting: Emergency Medicine

## 2015-12-09 DIAGNOSIS — G43909 Migraine, unspecified, not intractable, without status migrainosus: Secondary | ICD-10-CM | POA: Diagnosis present

## 2015-12-09 DIAGNOSIS — F17200 Nicotine dependence, unspecified, uncomplicated: Secondary | ICD-10-CM | POA: Insufficient documentation

## 2015-12-09 DIAGNOSIS — J45909 Unspecified asthma, uncomplicated: Secondary | ICD-10-CM | POA: Insufficient documentation

## 2015-12-09 DIAGNOSIS — E669 Obesity, unspecified: Secondary | ICD-10-CM | POA: Insufficient documentation

## 2015-12-09 DIAGNOSIS — Z79899 Other long term (current) drug therapy: Secondary | ICD-10-CM | POA: Diagnosis not present

## 2015-12-09 DIAGNOSIS — G43809 Other migraine, not intractable, without status migrainosus: Secondary | ICD-10-CM

## 2015-12-09 HISTORY — DX: Migraine, unspecified, not intractable, without status migrainosus: G43.909

## 2015-12-09 MED ORDER — IBUPROFEN 800 MG PO TABS: 800.0000 mg | ORAL_TABLET | Freq: Three times a day (TID) | ORAL | Status: DC | PRN

## 2015-12-09 MED ORDER — BUTALBITAL-APAP-CAFFEINE 50-325-40 MG PO TABS: 1.0000 | ORAL_TABLET | Freq: Four times a day (QID) | ORAL | Status: DC | PRN

## 2015-12-09 MED ORDER — BUTALBITAL-APAP-CAFFEINE 50-325-40 MG PO TABS
2.0000 | ORAL_TABLET | Freq: Once | ORAL | Status: AC
  Administered 2015-12-09: 2 via ORAL
  Filled 2015-12-09: qty 2

## 2015-12-09 MED ORDER — KETOROLAC TROMETHAMINE 60 MG/2ML IM SOLN
60.0000 mg | Freq: Once | INTRAMUSCULAR | Status: AC
  Administered 2015-12-09: 60 mg via INTRAMUSCULAR
  Filled 2015-12-09: qty 2

## 2015-12-09 NOTE — ED Notes (Signed)
Patient c/o intermittent HA x2 weeks.

## 2015-12-09 NOTE — ED Notes (Signed)
Migraine began this am. States history of same. Feels like usual migraine. No head injury.
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
## 2015-12-09 NOTE — Discharge Instructions (Signed)
Recurrent Migraine Headache °A migraine headache is very bad, throbbing pain on one or both sides of your head. Recurrent migraines keep coming back. Talk to your doctor about what things may bring on (trigger) your migraine headaches. °HOME CARE °· Only take medicines as told by your doctor. °· Lie down in a dark, quiet room when you have a migraine. °· Keep a journal to find out if certain things bring on migraine headaches. For example, write down: °¨ What you eat and drink. °¨ How much sleep you get. °¨ Any change to your diet or medicines. °· Lessen how much alcohol you drink. °· Quit smoking if you smoke. °· Get enough sleep. °· Lessen any stress in your life. °· Keep lights dim if bright lights bother you or make your migraines worse. °GET HELP IF: °· Medicine does not help your migraines. °· Your pain keeps coming back. °· You have a fever. °GET HELP RIGHT AWAY IF:  °· Your migraine becomes really bad. °· You have a stiff neck. °· You have trouble seeing. °· Your muscles are weak, or you lose muscle control. °· You lose your balance or have trouble walking. °· You feel like you will pass out (faint), or you pass out. °· You have really bad symptoms that are different than your first symptoms. °MAKE SURE YOU:  °· Understand these instructions. °· Will watch your condition. °· Will get help right away if you are not doing well or get worse. °  °This information is not intended to replace advice given to you by your health care provider. Make sure you discuss any questions you have with your health care provider. °  °Document Released: 05/17/2008 Document Revised: 08/13/2013 Document Reviewed: 04/15/2013 °Elsevier Interactive Patient Education ©2016 Elsevier Inc. ° °

## 2016-01-04 ENCOUNTER — Emergency Department: Payer: Medicaid Other

## 2016-01-04 ENCOUNTER — Encounter: Payer: Self-pay | Admitting: Emergency Medicine

## 2016-01-04 ENCOUNTER — Emergency Department
Admission: EM | Admit: 2016-01-04 | Discharge: 2016-01-04 | Disposition: A | Discharge status: Home / Self Care | Payer: Medicaid Other | Attending: Emergency Medicine | Admitting: Emergency Medicine

## 2016-01-04 DIAGNOSIS — Z3491 Encounter for supervision of normal pregnancy, unspecified, first trimester: Secondary | ICD-10-CM

## 2016-01-04 DIAGNOSIS — Z3A01 Less than 8 weeks gestation of pregnancy: Secondary | ICD-10-CM | POA: Diagnosis not present

## 2016-01-04 DIAGNOSIS — J45909 Unspecified asthma, uncomplicated: Secondary | ICD-10-CM | POA: Insufficient documentation

## 2016-01-04 DIAGNOSIS — R102 Pelvic and perineal pain: Secondary | ICD-10-CM

## 2016-01-04 DIAGNOSIS — N898 Other specified noninflammatory disorders of vagina: Secondary | ICD-10-CM | POA: Diagnosis present

## 2016-01-04 DIAGNOSIS — Z79899 Other long term (current) drug therapy: Secondary | ICD-10-CM | POA: Insufficient documentation

## 2016-01-04 DIAGNOSIS — O23591 Infection of other part of genital tract in pregnancy, first trimester: Secondary | ICD-10-CM | POA: Insufficient documentation

## 2016-01-04 DIAGNOSIS — O26899 Other specified pregnancy related conditions, unspecified trimester: Secondary | ICD-10-CM

## 2016-01-04 DIAGNOSIS — F17200 Nicotine dependence, unspecified, uncomplicated: Secondary | ICD-10-CM | POA: Diagnosis not present

## 2016-01-04 DIAGNOSIS — B3731 Acute candidiasis of vulva and vagina: Secondary | ICD-10-CM

## 2016-01-04 DIAGNOSIS — B373 Candidiasis of vulva and vagina: Secondary | ICD-10-CM

## 2016-01-04 DIAGNOSIS — E669 Obesity, unspecified: Secondary | ICD-10-CM | POA: Insufficient documentation

## 2016-01-04 LAB — CHLAMYDIA/NGC RT PCR (ARMC ONLY)
Chlamydia Tr: NOT DETECTED
N gonorrhoeae: NOT DETECTED

## 2016-01-04 LAB — WET PREP, GENITAL
Clue Cells Wet Prep HPF POC: NONE SEEN
Sperm: NONE SEEN
Trich, Wet Prep: NONE SEEN

## 2016-01-04 LAB — COMPREHENSIVE METABOLIC PANEL
ALBUMIN: 3.8 g/dL (ref 3.5–5.0)
ALT: 18 U/L (ref 14–54)
AST: 20 U/L (ref 15–41)
Alkaline Phosphatase: 50 U/L (ref 38–126)
Anion gap: 7 (ref 5–15)
BUN: 11 mg/dL (ref 6–20)
CHLORIDE: 108 mmol/L (ref 101–111)
CO2: 21 mmol/L — AB (ref 22–32)
CREATININE: 0.88 mg/dL (ref 0.44–1.00)
Calcium: 8.9 mg/dL (ref 8.9–10.3)
GFR calc Af Amer: >60 mL/min (ref >60)
GFR calc non Af Amer: >60 mL/min (ref >60)
GLUCOSE: 126 mg/dL — AB (ref 65–99)
Potassium: 3.8 mmol/L (ref 3.5–5.1)
SODIUM: 136 mmol/L (ref 135–145)
Total Bilirubin: 0.1 mg/dL — ABNORMAL LOW (ref 0.3–1.2)
Total Protein: 7.3 g/dL (ref 6.5–8.1)

## 2016-01-04 LAB — URINALYSIS COMPLETE WITH MICROSCOPIC (ARMC ONLY)
BILIRUBIN URINE: NEGATIVE
Bacteria, UA: NONE SEEN
Glucose, UA: NEGATIVE mg/dL
Hgb urine dipstick: NEGATIVE
Ketones, ur: NEGATIVE mg/dL
Leukocytes, UA: NEGATIVE
Nitrite: NEGATIVE
PH: 5 (ref 5.0–8.0)
Protein, ur: NEGATIVE mg/dL
Specific Gravity, Urine: 1.019 (ref 1.005–1.030)

## 2016-01-04 LAB — CBC
HCT: 38.3 % (ref 35.0–47.0)
Hemoglobin: 12.2 g/dL (ref 12.0–16.0)
MCH: 20.9 pg — AB (ref 26.0–34.0)
MCHC: 31.8 g/dL — AB (ref 32.0–36.0)
MCV: 65.7 fL — AB (ref 80.0–100.0)
PLATELETS: 282 10*3/uL (ref 150–440)
RBC: 5.84 MIL/uL — AB (ref 3.80–5.20)
RDW: 18.2 % — AB (ref 11.5–14.5)
WBC: 14.7 10*3/uL — ABNORMAL HIGH (ref 3.6–11.0)

## 2016-01-04 LAB — POCT PREGNANCY, URINE: Preg Test, Ur: POSITIVE — AB

## 2016-01-04 LAB — HCG, QUANTITATIVE, PREGNANCY: HCG, BETA CHAIN, QUANT, S: 73615 m[IU]/mL — AB (ref <5)

## 2016-01-04 LAB — LIPASE, BLOOD: LIPASE: 20 U/L (ref 11–51)

## 2016-01-04 MED ORDER — MICONAZOLE NITRATE 2 % VA CREA: 1.0000 | TOPICAL_CREAM | Freq: Every day | VAGINAL | Status: DC

## 2016-01-04 MED ORDER — FLUCONAZOLE 50 MG PO TABS: 150.0000 mg | ORAL_TABLET | Freq: Once | ORAL | Status: DC

## 2016-01-04 NOTE — Discharge Instructions (Signed)
If you have increased pain, fever, persistent vomiting or you feel worse in any way, return to the emergency department.

## 2016-01-04 NOTE — ED Notes (Addendum)
Pt reports vaginal discharge x3 days, reports clear in color, reports odor. Pt reports vomiting today. Pt reports right lower abdominal pain. Pt reports last period was 3/25, reports she could be pregnant but hasn't taken pregnancy test.

## 2016-01-04 NOTE — ED Provider Notes (Addendum)
[facility] Emergency Department Provider Note  ____________________________________________   I have reviewed the triage vital signs and the nursing notes.   HISTORY  Chief Complaint Emesis and Vaginal Discharge    HPI Donna Sanders is a 30 y.o. female who presents today complaining of vaginal discharge. She states that she had some off and on vomiting over the last couple weeks and she is worried that she might be pregnant. She states that she has had some suprapubic cramping now and again. She denies any focal abdominal pain to me specifically she denies any right lower quadrant pain. She indicates her suprapubic region when I ask her where the pain was. She is not having any hematemesis or melena or diarrhea or constipation. She states that her last missed her. Was March 25 and she is taking that she might be pregnant. She would like evaluation of a possible pregnancy. She denies purulent vaginal discharge she has a clear vaginal discharge has a history of yeast infections in the past it reminds her of that.She's had no fever or chills. Patient is G5 at this time.P4     Past Medical History  Diagnosis Date  . Asthma   . Obesity   . Migraine     Patient Active Problem List   Diagnosis Date Noted  . Abscess 04/09/2013  . Backache 04/09/2013  . Excessive fetal growth affecting management of mother, antepartum 03/26/2013  . Allergic rhinitis, seasonal 12/20/2012    Past Surgical History  Procedure Laterality Date  . Ankle arthroplasty      Current Outpatient Rx  Name  Route  Sig  Dispense  Refill  . albuterol (PROVENTIL HFA;VENTOLIN HFA) 108 (90 BASE) MCG/ACT inhaler   Inhalation   Inhale 2 puffs into the lungs every 6 (six) hours as needed for wheezing.         . butalbital-acetaminophen-caffeine (FIORICET) 50-325-40 MG tablet   Oral   Take 1-2 tablets by mouth every 6 (six) hours as needed for headache.   20 tablet   0   . ibuprofen  (ADVIL,MOTRIN) 800 MG tablet   Oral   Take 1 tablet (800 mg total) by mouth every 8 (eight) hours as needed.   30 tablet   0   . tobramycin (TOBREX) 0.3 % ophthalmic solution      2 drops every 4 hours while awake to each eye   5 mL   0     Allergies Penicillins  Family History  Problem Relation Age of Onset  . Asthma Mother   . Cancer Mother   . Depression Mother   . Migraines Mother     Social History Social History  Substance Use Topics  . Smoking status: Current Some Day Smoker -- 0.25 packs/day  . Smokeless tobacco: None  . Alcohol Use: No    Review of Systems Constitutional: No fever/chills Eyes: No visual changes. ENT: No sore throat. No stiff neck no neck pain Cardiovascular: Denies chest pain. Respiratory: Denies shortness of breath. Gastrointestinal:   Positive intermittent occasional vomiting.  No diarrhea.  No constipation. Genitourinary: Negative for dysuria. Musculoskeletal: Negative lower extremity swelling Skin: Negative for rash. Neurological: Negative for headaches, focal weakness or numbness. 10-point ROS otherwise negative.  ____________________________________________   PHYSICAL EXAM:  VITAL SIGNS: ED Triage Vitals  Enc Vitals Group     BP 01/04/16 1803 120/90 mmHg     Pulse Rate 01/04/16 1803 84     Resp 01/04/16 1803 16  Temp 01/04/16 1803 98.2 F (36.8 C)     Temp Source 01/04/16 1803 Oral     SpO2 01/04/16 1803 100 %     Weight 01/04/16 1803 240 lb (108.863 kg)     Height 01/04/16 1803 5\' 2"  (1.575 m)     Head Cir --      Peak Flow --      Pain Score 01/04/16 1804 6     Pain Loc --      Pain Edu? --      Excl. in GC? --     Constitutional: Alert and oriented. Well appearing and in no acute distress. Eyes: Conjunctivae are normal. PERRL. EOMI. Head: Atraumatic. Nose: No congestion/rhinnorhea. Mouth/Throat: Mucous membranes are moist.  Oropharynx non-erythematous. Neck: No stridor.   Nontender with no  meningismus Cardiovascular: Normal rate, regular rhythm. Grossly normal heart sounds.  Good peripheral circulation. Respiratory: Normal respiratory effort.  No retractions. Lungs CTAB. Abdominal: Soft and nontender. No distention. No guarding no rebound Back:  There is no focal tenderness or step off there is no midline tenderness there are no lesions noted. there is no CVA tenderness Pelvic exam: Female nurse chaperone present, no external lesions noted, a whitish nonpurulent vaginal discharge noted with no purulent discharge, no cervical motion tenderness, no adnexal tenderness or mass, there is no significant uterine tenderness or mass. No vaginal bleeding Musculoskeletal: No lower extremity tenderness. No joint effusions, no DVT signs strong distal pulses no edema Neurologic:  Normal speech and language. No gross focal neurologic deficits are appreciated.  Skin:  Skin is warm, dry and intact. No rash noted. Psychiatric: Mood and affect are normal. Speech and behavior are normal.  ____________________________________________   LABS (all labs ordered are listed, but only abnormal results are displayed)  Labs Reviewed  WET PREP, GENITAL - Abnormal; Notable for the following:    Yeast Wet Prep HPF POC PRESENT (*)    WBC, Wet Prep HPF POC RARE (*)    All other components within normal limits  COMPREHENSIVE METABOLIC PANEL - Abnormal; Notable for the following:    CO2 21 (*)    Glucose, Bld 126 (*)    Total Bilirubin 0.1 (*)    All other components within normal limits  CBC - Abnormal; Notable for the following:    WBC 14.7 (*)    RBC 5.84 (*)    MCV 65.7 (*)    MCH 20.9 (*)    MCHC 31.8 (*)    RDW 18.2 (*)    All other components within normal limits  URINALYSIS COMPLETEWITH MICROSCOPIC ([facility] ONLY) - Abnormal; Notable for the following:    Color, Urine YELLOW (*)    APPearance CLEAR (*)    Squamous Epithelial / LPF 0-5 (*)    All other components within normal limits  HCG,  QUANTITATIVE, PREGNANCY - Abnormal; Notable for the following:    hCG, Beta Chain, Quant, S [id number] (*)    All other components within normal limits  POCT PREGNANCY, URINE - Abnormal; Notable for the following:    Preg Test, Ur POSITIVE (*)    All other components within normal limits  CHLAMYDIA/NGC RT PCR ([facility] ONLY)  LIPASE, BLOOD  POC URINE PREG, ED   ____________________________________________  EKG  I personally interpreted any EKGs ordered by me or triage  ____________________________________________  RADIOLOGY  I reviewed any imaging ordered by me or triage that were performed during my shift and, if possible, patient and/or family made aware of any abnormal  findings. ____________________________________________   PROCEDURES  Procedure(s) performed: None  Critical Care performed: None  ____________________________________________   INITIAL IMPRESSION / ASSESSMENT AND PLAN / ED COURSE  Pertinent labs & imaging results that were available during my care of the patient were reviewed by me and considered in my medical decision making (see chart for details).  Benign abdomen, patient is a yeast infection. Patient in no acute distress otherwise. Serial abdominal exams betray no evidence of appendicitis. No other evidence of acute pathology noted with a normal early pregnancy ultrasound. White count slightly up this is a nonspecific finding. No evidence of PID, awaiting directly results prior to discharge. Patient made aware of all findings. Abdomen completely benign at this time. Return precautions follow-up given and understood. ____________________________________________   FINAL CLINICAL IMPRESSION(S) / ED DIAGNOSES  Final diagnoses:  Pelvic pain affecting pregnancy      This chart was dictated using voice recognition software.  Despite best efforts to proofread,  errors can occur which can change meaning.     Jeanmarie Plant, MD 01/04/16  [id number]  Jeanmarie Plant, MD 01/04/16 1610  Jeanmarie Plant, MD 01/04/16 2204

## 2016-01-21 ENCOUNTER — Encounter: Payer: Self-pay | Admitting: Emergency Medicine

## 2016-01-21 ENCOUNTER — Emergency Department
Admission: EM | Admit: 2016-01-21 | Discharge: 2016-01-21 | Disposition: A | Discharge status: Home / Self Care | Payer: Medicaid Other | Attending: Emergency Medicine | Admitting: Emergency Medicine

## 2016-01-21 ENCOUNTER — Emergency Department: Payer: Medicaid Other

## 2016-01-21 DIAGNOSIS — N938 Other specified abnormal uterine and vaginal bleeding: Secondary | ICD-10-CM | POA: Diagnosis not present

## 2016-01-21 DIAGNOSIS — J45909 Unspecified asthma, uncomplicated: Secondary | ICD-10-CM | POA: Insufficient documentation

## 2016-01-21 DIAGNOSIS — F17200 Nicotine dependence, unspecified, uncomplicated: Secondary | ICD-10-CM | POA: Insufficient documentation

## 2016-01-21 DIAGNOSIS — Z79899 Other long term (current) drug therapy: Secondary | ICD-10-CM | POA: Insufficient documentation

## 2016-01-21 DIAGNOSIS — IMO0002 Reserved for concepts with insufficient information to code with codable children: Secondary | ICD-10-CM

## 2016-01-21 DIAGNOSIS — N939 Abnormal uterine and vaginal bleeding, unspecified: Secondary | ICD-10-CM | POA: Diagnosis present

## 2016-01-21 LAB — CBC
HEMATOCRIT: 36.5 % (ref 35.0–47.0)
HEMOGLOBIN: 11.8 g/dL — AB (ref 12.0–16.0)
MCH: 21.2 pg — ABNORMAL LOW (ref 26.0–34.0)
MCHC: 32.2 g/dL (ref 32.0–36.0)
MCV: 65.7 fL — AB (ref 80.0–100.0)
Platelets: 292 10*3/uL (ref 150–440)
RBC: 5.55 MIL/uL — AB (ref 3.80–5.20)
RDW: 18.2 % — AB (ref 11.5–14.5)
WBC: 10.6 10*3/uL (ref 3.6–11.0)

## 2016-01-21 LAB — ABO/RH: ABO/RH(D): A POS

## 2016-01-21 LAB — HCG, QUANTITATIVE, PREGNANCY: hCG, Beta Chain, Quant, S: 1403 m[IU]/mL — ABNORMAL HIGH (ref <5)

## 2016-01-21 MED ORDER — METHYLERGONOVINE MALEATE 0.2 MG PO TABS: 0.2000 mg | ORAL_TABLET | Freq: Three times a day (TID) | ORAL | Status: AC | PRN

## 2016-01-21 NOTE — Discharge Instructions (Signed)
Your workup was generally reassuring today.  We removed a blood clot from your cervix which was likely causing your heavy bleeding.  If you continue to bleed heavily, please take the prescribed a medication.  Either way we recommend that you call the office of Dr. Valentino Saxonherry tomorrow and schedule the next available follow-up appointment.  Please return to the emergency department if you develop new or worsening symptoms that concern you.

## 2016-01-21 NOTE — ED Notes (Signed)
Pt to ED today with lower abdominal pain and "heavy vaginal bleeding" with filling up 3 pads in the last 2 hours.  Pt reports having an abortion 5 days ago.  Pt also endorses vomiting x2 .  VSS .

## 2016-01-21 NOTE — ED Provider Notes (Signed)
[facility] Emergency Department Provider Note  ____________________________________________  Time seen: Approximately 5:02 PM  I have reviewed the triage vital signs and the nursing notes.   HISTORY  Chief Complaint Vaginal Bleeding and Abdominal Pain    HPI Donna Sanders is a 30 y.o. female G5 P4 Ab1 presents with a chief complaint of heavy vaginal bleeding about 5 days after an elective abortion.  She reports that when she left from the abortion about 5 days ago she was having some cramping but no bleeding.  She describes the procedure that she had as the "suction kind".  She reports that the bleeding started 2 days ago and has been heavy, and that today alone she has gone through 3 pads in the last 2 hours.  Today she has had 2 episodes of vomiting and she reports mild to moderate intermittent abdominal cramps.  She is concerned about the bleeding since it just started after about 3 days of not having any bleeding.  She has not called her clinic back.  She has a history of morbid obesity and mild, well-controlled asthma.  She describes the bleeding as severe and nothing makes it better nor worse.She denies fever/chills, chest pain, upper abdominal pain, shortness of breath, dysuria.   Past Medical History  Diagnosis Date  . Asthma   . Obesity   . Migraine     Patient Active Problem List   Diagnosis Date Noted  . Abscess 04/09/2013  . Backache 04/09/2013  . Excessive fetal growth affecting management of mother, antepartum 03/26/2013  . Allergic rhinitis, seasonal 12/20/2012    Past Surgical History  Procedure Laterality Date  . Ankle arthroplasty      Current Outpatient Rx  Name  Route  Sig  Dispense  Refill  . albuterol (PROVENTIL HFA;VENTOLIN HFA) 108 (90 BASE) MCG/ACT inhaler   Inhalation   Inhale 2 puffs into the lungs every 6 (six) hours as needed for wheezing.         . butalbital-acetaminophen-caffeine (FIORICET) 50-325-40 MG  tablet   Oral   Take 1-2 tablets by mouth every 6 (six) hours as needed for headache.   20 tablet   0   . ibuprofen (ADVIL,MOTRIN) 800 MG tablet   Oral   Take 1 tablet (800 mg total) by mouth every 8 (eight) hours as needed.   30 tablet   0   . methylergonovine (METHERGINE) 0.2 MG tablet   Oral   Take 1 tablet (0.2 mg total) by mouth 3 (three) times daily as needed.   6 tablet   0   . miconazole (EQ MICONAZOLE 7 DAY TREATMENT) 2 % vaginal cream   Vaginal   Place 1 Applicatorful vaginally at bedtime.   45 g   0   . tobramycin (TOBREX) 0.3 % ophthalmic solution      2 drops every 4 hours while awake to each eye   5 mL   0     Allergies Penicillins  Family History  Problem Relation Age of Onset  . Asthma Mother   . Cancer Mother   . Depression Mother   . Migraines Mother     Social History Social History  Substance Use Topics  . Smoking status: Current Some Day Smoker -- 0.25 packs/day  . Smokeless tobacco: None  . Alcohol Use: No    Review of Systems Constitutional: No fever/chills Eyes: No visual changes. ENT: No sore throat. Cardiovascular: Denies chest pain. Respiratory: Denies shortness of breath. Gastrointestinal:  No abdominal pain.  No nausea, no vomiting.  No diarrhea.  No constipation. Genitourinary: Negative for dysuria. Musculoskeletal: Negative for back pain. Skin: Negative for rash. Neurological: Negative for headaches, focal weakness or numbness.  10-point ROS otherwise negative.  ____________________________________________   PHYSICAL EXAM:  VITAL SIGNS: ED Triage Vitals  Enc Vitals Group     BP 01/21/16 1639 153/84 mmHg     Pulse Rate 01/21/16 1639 80     Resp 01/21/16 1639 20     Temp 01/21/16 1639 98.2 F (36.8 C)     Temp Source 01/21/16 1639 Axillary     SpO2 01/21/16 1639 100 %     Weight 01/21/16 1639 240 lb (108.863 kg)     Height 01/21/16 1639 5\' 2"  (1.575 m)     Head Cir --      Peak Flow --      Pain Score  01/21/16 1642 10     Pain Loc --      Pain Edu? --      Excl. in GC? --     Constitutional: Alert and oriented. Well appearing and in no acute distress. Eyes: Conjunctivae are normal. PERRL. EOMI. Head: Atraumatic. Nose: No congestion/rhinnorhea. Mouth/Throat: Mucous membranes are moist.  Oropharynx non-erythematous. Neck: No stridor.  No meningeal signs.   Cardiovascular: Normal rate, regular rhythm. Good peripheral circulation. Grossly normal heart sounds.   Respiratory: Normal respiratory effort.  No retractions. Lungs CTAB. Gastrointestinal: Soft and nontender. No distention.  Genitourinary: Normal external exam.  Moderate amount of dark blood and clots in vaginal vault.  Large clot vs tissue (more likely clot) is within the os of the cervix, which I removed with ring forceps.  No other clots/products visible in the os.  Cervix otherwise healthy appearing. Musculoskeletal: No lower extremity tenderness nor edema. No gross deformities of extremities. Neurologic:  Normal speech and language. No gross focal neurologic deficits are appreciated.  Skin:  Skin is warm, dry and intact. No rash noted. Psychiatric: Mood and affect are normal. Speech and behavior are normal.  ____________________________________________   LABS (all labs ordered are listed, but only abnormal results are displayed)  Labs Reviewed  CBC - Abnormal; Notable for the following:    RBC 5.55 (*)    Hemoglobin 11.8 (*)    MCV 65.7 (*)    MCH 21.2 (*)    RDW 18.2 (*)    All other components within normal limits  HCG, QUANTITATIVE, PREGNANCY - Abnormal; Notable for the following:    hCG, Beta Chain, Quant, S 1403 (*)    All other components within normal limits  ABO/RH   ____________________________________________  EKG  None ____________________________________________  RADIOLOGY   [place] Pelvis Complete  01/21/2016  CLINICAL DATA:  Heavy vaginal bleeding 5 days after abortion. EXAM: TRANSABDOMINAL  ULTRASOUND OF PELVIS TECHNIQUE: Transabdominal ultrasound examination of the pelvis was performed including evaluation of the uterus, ovaries, adnexal regions, and pelvic cul-de-sac. COMPARISON:  None. FINDINGS: Uterus Measurements: 12.5 x 5.4 x 7.4 cm. No fibroids or other mass visualized. Endometrium Thickness: 13.3 mm. The endometrium is a borderline at the fundus with no internal vascularity. Right ovary Measurements: 3.8 x 2.2 x 2.0 cm. Normal appearance/no adnexal mass. Left ovary Measurements: 1.9 x 1.5 x 1.1 cm. Normal appearance/no adnexal mass. Other findings:  No abnormal free fluid. IMPRESSION: The endometrial stripe complex measures 13.3 mm with no internal vascularity. Findings are nonspecific and could indicate endometrial blood products, although hypovascular retained products of conception cannot be  excluded. Consider short term follow-up pelvic ultrasound or further evaluation with pelvic MRI with and without IV contrast, as clinically warranted. Electronically Signed   By: Gerome Sam III M.D   On: 01/21/2016 18:37    ____________________________________________   PROCEDURES  Procedure(s) performed: None  Critical Care performed: No ____________________________________________   INITIAL IMPRESSION / ASSESSMENT AND PLAN / ED COURSE  Pertinent labs & imaging results that were available during my care of the patient were reviewed by me and considered in my medical decision making (see chart for details).  The patient had either a large clot that was stuck in the cervical os and causing her to continue to bleed or it is products of conception.  Either way I removed the foreign material from the os and she is not currently leaking any more blood.  I will proceed with pelvic ultrasound to evaluate for retained products.  The patient is stable with normal vital signs and a reassuring hemoglobin.  She is A+ and does not need RhoGAM.  ----------------------------------------- 7:52  PM on 01/21/2016 -----------------------------------------  Ultrasound nonspecific.  Patient has had a little bit more bleeding but not much since the pelvic exam.  I discussed the case by phone with Dr. Valentino Saxon.  She recommended a prescription for 6 tablets of Methergine to be used if the bleeding continues.  She also agreed that outpatient follow-up in her clinic as appropriate.  I went over all this with the patient who understands and agrees with the plan.  I advised the use of ibuprofen and Tylenol.  I gave my usual and customary return precautions.  ____________________________________________  FINAL CLINICAL IMPRESSION(S) / ED DIAGNOSES  Final diagnoses:  Postoperative vaginal bleeding     MEDICATIONS GIVEN DURING THIS VISIT:  Medications - No data to display   NEW OUTPATIENT MEDICATIONS STARTED DURING THIS VISIT:  New Prescriptions   METHYLERGONOVINE (METHERGINE) 0.2 MG TABLET    Take 1 tablet (0.2 mg total) by mouth 3 (three) times daily as needed.      Note:  This document was prepared using Dragon voice recognition software and may include unintentional dictation errors.   Loleta Rose, MD 01/21/16 [id number]

## 2016-03-01 ENCOUNTER — Emergency Department
Admission: EM | Admit: 2016-03-01 | Discharge: 2016-03-01 | Disposition: A | Discharge status: Home / Self Care | Payer: Medicaid Other | Attending: Emergency Medicine | Admitting: Emergency Medicine

## 2016-03-01 ENCOUNTER — Encounter: Payer: Self-pay | Admitting: Emergency Medicine

## 2016-03-01 DIAGNOSIS — G43801 Other migraine, not intractable, with status migrainosus: Secondary | ICD-10-CM | POA: Diagnosis not present

## 2016-03-01 DIAGNOSIS — J45909 Unspecified asthma, uncomplicated: Secondary | ICD-10-CM | POA: Diagnosis not present

## 2016-03-01 DIAGNOSIS — F17200 Nicotine dependence, unspecified, uncomplicated: Secondary | ICD-10-CM | POA: Insufficient documentation

## 2016-03-01 DIAGNOSIS — H53143 Visual discomfort, bilateral: Secondary | ICD-10-CM

## 2016-03-01 MED ORDER — IBUPROFEN 800 MG PO TABS: 800.0000 mg | ORAL_TABLET | Freq: Three times a day (TID) | ORAL | Status: DC | PRN

## 2016-03-01 MED ORDER — KETOROLAC TROMETHAMINE 30 MG/ML IJ SOLN
60.0000 mg | Freq: Once | INTRAMUSCULAR | Status: AC
  Administered 2016-03-01: 60 mg via INTRAMUSCULAR
  Filled 2016-03-01: qty 2

## 2016-03-01 MED ORDER — METOCLOPRAMIDE HCL 5 MG PO TABS: 5.0000 mg | ORAL_TABLET | Freq: Four times a day (QID) | ORAL | Status: DC | PRN

## 2016-03-01 MED ORDER — METOCLOPRAMIDE HCL 10 MG PO TABS
10.0000 mg | ORAL_TABLET | Freq: Once | ORAL | Status: AC
  Administered 2016-03-01: 10 mg via ORAL
  Filled 2016-03-01 (×2): qty 1

## 2016-03-01 NOTE — Discharge Instructions (Signed)
Please drink plenty of fluid, eat small rent regular meal struck the day, and get plenty of rest to prevent migraines.  If you do develop a migraine, please take your medication immediately. It is much harder to treat her migraine after it has become full blown, than if you catch it early.  Return to the emergency department if you develop severe pain, fever, rash, numbness weakness or tingling, vomiting, or any other symptoms concerning to you.  Migraine Headache A migraine headache is very bad, throbbing pain on one or both sides of your head. Talk to your doctor about what things may bring on (trigger) your migraine headaches. HOME CARE  Only take medicines as told by your doctor.  Lie down in a dark, quiet room when you have a migraine.  Keep a journal to find out if certain things bring on migraine headaches. For example, write down:  What you eat and drink.  How much sleep you get.  Any change to your diet or medicines.  Lessen how much alcohol you drink.  Quit smoking if you smoke.  Get enough sleep.  Lessen any stress in your life.  Keep lights dim if bright lights bother you or make your migraines worse. GET HELP RIGHT AWAY IF:   Your migraine becomes really bad.  You have a fever.  You have a stiff neck.  You have trouble seeing.  Your muscles are weak, or you lose muscle control.  You lose your balance or have trouble walking.  You feel like you will pass out (faint), or you pass out.  You have really bad symptoms that are different than your first symptoms. MAKE SURE YOU:   Understand these instructions.  Will watch your condition.  Will get help right away if you are not doing well or get worse.   This information is not intended to replace advice given to you by your health care provider. Make sure you discuss any questions you have with your health care provider.   Document Released: 05/17/2008 Document Revised: 10/31/2011 Document Reviewed:  04/15/2013 Elsevier Interactive Patient Education [employer]2016 Else[employer] Inc.

## 2016-03-01 NOTE — ED Notes (Signed)
Pt ambulatory to triage with steady gait, pt c/o front headache since yesterday. Pt reports took Tylenol and Excedrin yesterday without relief. Pt denies taking anything for headache today, reports hx of headache. Pt denies nausea, vomiting, or vision problems. Pt alert and oriented x 4. No increased work in breathing noted.

## 2016-03-01 NOTE — ED Provider Notes (Signed)
[facility] Emergency Department Provider Note  ____________________________________________  Time seen: Approximately 9:29 PM  I have reviewed the triage vital signs and the nursing notes.   HISTORY  Chief Complaint Migraine    HPI MARISSIA BLACKHAM is a 30 y.o. female with a history of migraines presenting with a typical migraine. The patient reports that since Friday she has had an intermittent "all over" headache that is associated with photophobia, typical of her previous migraines. Yesterday she took Excedrin Migraine without any improvement. She is not had any trauma, tick bites, fever or rash, neck pain or stiffness, changes in vision or speech, numbness tingling or weakness.   Past Medical History  Diagnosis Date  . Asthma   . Obesity   . Migraine     Patient Active Problem List   Diagnosis Date Noted  . Abscess 04/09/2013  . Backache 04/09/2013  . Excessive fetal growth affecting management of mother, antepartum 03/26/2013  . Allergic rhinitis, seasonal 12/20/2012    Past Surgical History  Procedure Laterality Date  . Ankle arthroplasty      Current Outpatient Rx  Name  Route  Sig  Dispense  Refill  . albuterol (PROVENTIL HFA;VENTOLIN HFA) 108 (90 BASE) MCG/ACT inhaler   Inhalation   Inhale 2 puffs into the lungs every 6 (six) hours as needed for wheezing.         . butalbital-acetaminophen-caffeine (FIORICET) 50-325-40 MG tablet   Oral   Take 1-2 tablets by mouth every 6 (six) hours as needed for headache.   20 tablet   0   . ibuprofen (ADVIL,MOTRIN) 800 MG tablet   Oral   Take 1 tablet (800 mg total) by mouth every 8 (eight) hours as needed.   20 tablet   0   . metoCLOPramide (REGLAN) 5 MG tablet   Oral   Take 1 tablet (5 mg total) by mouth every 6 (six) hours as needed for nausea or vomiting.   12 tablet   0   . miconazole (EQ MICONAZOLE 7 DAY TREATMENT) 2 % vaginal cream   Vaginal   Place 1 Applicatorful vaginally  at bedtime.   45 g   0   . tobramycin (TOBREX) 0.3 % ophthalmic solution      2 drops every 4 hours while awake to each eye   5 mL   0     Allergies Penicillins  Family History  Problem Relation Age of Onset  . Asthma Mother   . Cancer Mother   . Depression Mother   . Migraines Mother     Social History Social History  Substance Use Topics  . Smoking status: Current Some Day Smoker -- 0.25 packs/day  . Smokeless tobacco: None  . Alcohol Use: Yes    Review of Systems Constitutional: No fever/chills.No lightheadedness or syncope. Eyes: No visual changes. ENT: No sore throat. No congestion or rhinorrhea. Cardiovascular: Denies chest pain. Denies palpitations. Respiratory: Denies shortness of breath.  No cough. Gastrointestinal: No abdominal pain.  No nausea, no vomiting.  No diarrhea.  No constipation. Genitourinary: Negative for dysuria. Musculoskeletal: Negative for back pain. Skin: Negative for rash. Neurological: Positive for headaches. No focal numbness, tingling or weakness. Positive photophobia.  10-point ROS otherwise negative.  ____________________________________________   PHYSICAL EXAM:  VITAL SIGNS: ED Triage Vitals  Enc Vitals Group     BP 03/01/16 1957 106/57 mmHg     Pulse Rate 03/01/16 1957 89     Resp 03/01/16 1957 18  Temp 03/01/16 1957 98.3 F (36.8 C)     Temp Source 03/01/16 1957 Oral     SpO2 03/01/16 1957 99 %     Weight 03/01/16 1957 240 lb (108.863 kg)     Height 03/01/16 1957 5\' 2"  (1.575 m)     Head Cir --      Peak Flow --      Pain Score 03/01/16 1957 8     Pain Loc --      Pain Edu? --      Excl. in GC? --     Constitutional: Alert and oriented. Well appearing and in no acute distress. Answers questions appropriately. Eyes: Conjunctivae are normal.  EOMI. No scleral icterus.PERRLA. No nystagmus. Head: Atraumatic. Nose: No congestion/rhinnorhea. Mouth/Throat: Mucous membranes are moist.  Neck: No stridor.  Supple.   No meningismus. Cardiovascular: Normal rate, regular rhythm. No murmurs, rubs or gallops.  Respiratory: Normal respiratory effort.  No accessory muscle use or retractions. Lungs CTAB.  No wheezes, rales or ronchi. Gastrointestinal: Obese. Soft, nontender and nondistended.  No guarding or rebound.  No peritoneal signs. Musculoskeletal: No LE edema. No ttp in the calves or palpable cords.  Negative Homan's sign. Neurologic:  A&Ox3.  Speech is clear.  Face and smile are symmetric.  EOMI. PERRLA. No nystagmus.  Moves all extremities well. Normal gait without ataxia. Skin:  Skin is warm, dry and intact. No rash noted. Psychiatric: Mood and affect are normal. Speech and behavior are normal.  Normal judgement.  ____________________________________________   LABS (all labs ordered are listed, but only abnormal results are displayed)  Labs Reviewed - No data to display ____________________________________________  EKG  Not indicated ____________________________________________  RADIOLOGY  No results found.  ____________________________________________   PROCEDURES  Procedure(s) performed: None  Critical Care performed: No ____________________________________________   INITIAL IMPRESSION / ASSESSMENT AND PLAN / ED COURSE  Pertinent labs & imaging results that were available during my care of the patient were reviewed by me and considered in my medical decision making (see chart for details).  30 y.o. female with a history of migraine presenting with migraine. Overall, the patient is well-appearing without any acute neurologic deficits per she has no vital sign abnormalities. There is no clinical evidence of acute CVA, and subarachnoid hemorrhage is very unlikely. There is no signs or symptoms that would be consistent with meningitis. I will treat her symptomatically, Thurston Holenne and discharge home if she is feeling better.  ----------------------------------------- 10:28 PM on  03/01/2016 -----------------------------------------  At this time, the patient states that she is completely asymptomatic. She is ambulatory and tolerating by mouth. We will plan to discharge her home with close PMD follow-up. She understands return precautions as well as follow-up instructions.  ____________________________________________  FINAL CLINICAL IMPRESSION(S) / ED DIAGNOSES  Final diagnoses:  Other migraine with status migrainosus, not intractable  Photophobia of both eyes      NEW MEDICATIONS STARTED DURING THIS VISIT:  New Prescriptions   IBUPROFEN (ADVIL,MOTRIN) 800 MG TABLET    Take 1 tablet (800 mg total) by mouth every 8 (eight) hours as needed.   METOCLOPRAMIDE (REGLAN) 5 MG TABLET    Take 1 tablet (5 mg total) by mouth every 6 (six) hours as needed for nausea or vomiting.     Rockne MenghiniAnne-Caroline Damare Serano, MD 03/01/16 2228

## 2016-12-12 ENCOUNTER — Emergency Department: Payer: Medicaid Other

## 2016-12-12 ENCOUNTER — Encounter: Payer: Self-pay | Admitting: *Deleted

## 2016-12-12 ENCOUNTER — Emergency Department
Admission: EM | Admit: 2016-12-12 | Discharge: 2016-12-12 | Disposition: A | Discharge status: Home / Self Care | Payer: Medicaid Other | Attending: Emergency Medicine | Admitting: Emergency Medicine

## 2016-12-12 DIAGNOSIS — Y9389 Activity, other specified: Secondary | ICD-10-CM | POA: Insufficient documentation

## 2016-12-12 DIAGNOSIS — Z79899 Other long term (current) drug therapy: Secondary | ICD-10-CM | POA: Insufficient documentation

## 2016-12-12 DIAGNOSIS — S3992XA Unspecified injury of lower back, initial encounter: Secondary | ICD-10-CM | POA: Insufficient documentation

## 2016-12-12 DIAGNOSIS — Y999 Unspecified external cause status: Secondary | ICD-10-CM | POA: Diagnosis not present

## 2016-12-12 DIAGNOSIS — G8911 Acute pain due to trauma: Secondary | ICD-10-CM

## 2016-12-12 DIAGNOSIS — J45909 Unspecified asthma, uncomplicated: Secondary | ICD-10-CM | POA: Diagnosis not present

## 2016-12-12 DIAGNOSIS — F17200 Nicotine dependence, unspecified, uncomplicated: Secondary | ICD-10-CM | POA: Diagnosis not present

## 2016-12-12 DIAGNOSIS — Y929 Unspecified place or not applicable: Secondary | ICD-10-CM | POA: Diagnosis not present

## 2016-12-12 MED ORDER — DIAZEPAM 5 MG PO TABS
5.0000 mg | ORAL_TABLET | Freq: Once | ORAL | Status: AC
  Administered 2016-12-12: 5 mg via ORAL
  Filled 2016-12-12: qty 1

## 2016-12-12 MED ORDER — CYCLOBENZAPRINE HCL 10 MG PO TABS
10.0000 mg | ORAL_TABLET | Freq: Three times a day (TID) | ORAL | 0 refills | Status: DC | PRN
Start: 2016-12-12 — End: 2016-12-12

## 2016-12-12 MED ORDER — TRAMADOL HCL 50 MG PO TABS: 50.0000 mg | ORAL_TABLET | Freq: Four times a day (QID) | ORAL | 0 refills | Status: DC | PRN

## 2016-12-12 MED ORDER — DIAZEPAM 5 MG PO TABS: 5.0000 mg | ORAL_TABLET | Freq: Four times a day (QID) | ORAL | 0 refills | Status: DC | PRN

## 2016-12-12 MED ORDER — NAPROXEN 500 MG PO TABS: 500.0000 mg | ORAL_TABLET | Freq: Two times a day (BID) | ORAL | 0 refills | Status: DC

## 2016-12-12 MED ORDER — HYDROCODONE-ACETAMINOPHEN 5-325 MG PO TABS
1.0000 | ORAL_TABLET | Freq: Once | ORAL | Status: AC
  Administered 2016-12-12: 1 via ORAL
  Filled 2016-12-12: qty 1

## 2016-12-12 NOTE — ED Notes (Signed)

## 2016-12-12 NOTE — Discharge Instructions (Signed)
Please follow up with the primary care provider of your choice for symptoms that are not improving over the next few days. Take the medications as prescribed. Return to the ER for symptoms that change or worsen if unable to schedule an appointment.
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
## 2016-12-12 NOTE — ED Notes (Addendum)
Pt is upset that stronger pain medication is not given and prescribed to take home; pt refused to go over discharge paperwork and asked to speak to the provider; provider notified.
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
## 2016-12-12 NOTE — ED Notes (Signed)
Patient refuses urine pregnancy test. States just finished menses and last sexual activity was 2 months ago. Informed that did not rule out pregnancy. Still states she will not take pregnancy test. Xray notified. Patient states she will sign waver for xray.

## 2017-04-30 ENCOUNTER — Emergency Department (HOSPITAL_COMMUNITY): Payer: No Typology Code available for payment source

## 2017-04-30 ENCOUNTER — Encounter (HOSPITAL_COMMUNITY): Payer: Self-pay | Admitting: Emergency Medicine

## 2017-04-30 ENCOUNTER — Emergency Department (HOSPITAL_COMMUNITY)
Admission: EM | Admit: 2017-04-30 | Discharge: 2017-04-30 | Disposition: A | Discharge status: Home / Self Care | Payer: No Typology Code available for payment source | Attending: Emergency Medicine | Admitting: Emergency Medicine

## 2017-04-30 DIAGNOSIS — Y998 Other external cause status: Secondary | ICD-10-CM | POA: Insufficient documentation

## 2017-04-30 DIAGNOSIS — S161XXA Strain of muscle, fascia and tendon at neck level, initial encounter: Secondary | ICD-10-CM | POA: Diagnosis not present

## 2017-04-30 DIAGNOSIS — Y9389 Activity, other specified: Secondary | ICD-10-CM | POA: Insufficient documentation

## 2017-04-30 DIAGNOSIS — Z79899 Other long term (current) drug therapy: Secondary | ICD-10-CM | POA: Insufficient documentation

## 2017-04-30 DIAGNOSIS — Y92410 Unspecified street and highway as the place of occurrence of the external cause: Secondary | ICD-10-CM | POA: Diagnosis not present

## 2017-04-30 DIAGNOSIS — F17200 Nicotine dependence, unspecified, uncomplicated: Secondary | ICD-10-CM | POA: Diagnosis not present

## 2017-04-30 DIAGNOSIS — J45909 Unspecified asthma, uncomplicated: Secondary | ICD-10-CM | POA: Insufficient documentation

## 2017-04-30 DIAGNOSIS — S199XXA Unspecified injury of neck, initial encounter: Secondary | ICD-10-CM | POA: Diagnosis present

## 2017-04-30 LAB — POC URINE PREG, ED: PREG TEST UR: NEGATIVE

## 2017-04-30 MED ORDER — IBUPROFEN 800 MG PO TABS: 800.0000 mg | ORAL_TABLET | Freq: Three times a day (TID) | ORAL | 0 refills | Status: DC | PRN

## 2017-04-30 MED ORDER — CYCLOBENZAPRINE HCL 10 MG PO TABS: 10.0000 mg | ORAL_TABLET | Freq: Two times a day (BID) | ORAL | 0 refills | Status: DC | PRN

## 2017-04-30 NOTE — ED Notes (Signed)
Pt departed in NAD, refused use of wheelchair.  
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
## 2017-04-30 NOTE — ED Notes (Signed)
Patient transported to X-ray 

## 2017-04-30 NOTE — ED Notes (Signed)
ED Provider at bedside. 
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
## 2017-05-13 ENCOUNTER — Emergency Department (HOSPITAL_COMMUNITY): Payer: No Typology Code available for payment source

## 2017-05-13 ENCOUNTER — Encounter (HOSPITAL_COMMUNITY): Payer: Self-pay | Admitting: Emergency Medicine

## 2017-05-13 ENCOUNTER — Emergency Department (HOSPITAL_COMMUNITY)
Admission: EM | Admit: 2017-05-13 | Discharge: 2017-05-13 | Disposition: A | Discharge status: Home / Self Care | Payer: No Typology Code available for payment source | Attending: Emergency Medicine | Admitting: Emergency Medicine

## 2017-05-13 DIAGNOSIS — Y9389 Activity, other specified: Secondary | ICD-10-CM | POA: Diagnosis not present

## 2017-05-13 DIAGNOSIS — S161XXA Strain of muscle, fascia and tendon at neck level, initial encounter: Secondary | ICD-10-CM

## 2017-05-13 DIAGNOSIS — S39012A Strain of muscle, fascia and tendon of lower back, initial encounter: Secondary | ICD-10-CM | POA: Diagnosis not present

## 2017-05-13 DIAGNOSIS — Z79899 Other long term (current) drug therapy: Secondary | ICD-10-CM | POA: Insufficient documentation

## 2017-05-13 DIAGNOSIS — J45909 Unspecified asthma, uncomplicated: Secondary | ICD-10-CM | POA: Insufficient documentation

## 2017-05-13 DIAGNOSIS — S199XXA Unspecified injury of neck, initial encounter: Secondary | ICD-10-CM | POA: Diagnosis present

## 2017-05-13 DIAGNOSIS — Y998 Other external cause status: Secondary | ICD-10-CM | POA: Diagnosis not present

## 2017-05-13 DIAGNOSIS — Y92410 Unspecified street and highway as the place of occurrence of the external cause: Secondary | ICD-10-CM | POA: Insufficient documentation

## 2017-05-13 DIAGNOSIS — F17200 Nicotine dependence, unspecified, uncomplicated: Secondary | ICD-10-CM | POA: Insufficient documentation

## 2017-05-13 LAB — POC URINE PREG, ED: PREG TEST UR: NEGATIVE

## 2017-05-13 MED ORDER — ACETAMINOPHEN 325 MG PO TABS: 650.0000 mg | ORAL_TABLET | Freq: Once | ORAL | Status: DC

## 2017-05-13 MED ORDER — NAPROXEN 500 MG PO TABS: 500.0000 mg | ORAL_TABLET | Freq: Two times a day (BID) | ORAL | 0 refills | Status: DC

## 2017-05-13 MED ORDER — METHOCARBAMOL 500 MG PO TABS: 1000.0000 mg | ORAL_TABLET | Freq: Four times a day (QID) | ORAL | 0 refills | Status: DC

## 2017-05-13 MED ORDER — METHOCARBAMOL 500 MG PO TABS
1000.0000 mg | ORAL_TABLET | Freq: Once | ORAL | Status: AC
  Administered 2017-05-13: 1000 mg via ORAL
  Filled 2017-05-13: qty 2

## 2017-05-13 MED ORDER — NAPROXEN 250 MG PO TABS
500.0000 mg | ORAL_TABLET | Freq: Once | ORAL | Status: AC
  Administered 2017-05-13: 500 mg via ORAL
  Filled 2017-05-13: qty 2
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
## 2017-09-13 DIAGNOSIS — J45909 Unspecified asthma, uncomplicated: Secondary | ICD-10-CM | POA: Diagnosis not present

## 2017-09-13 DIAGNOSIS — J209 Acute bronchitis, unspecified: Secondary | ICD-10-CM | POA: Insufficient documentation

## 2017-09-13 DIAGNOSIS — F17200 Nicotine dependence, unspecified, uncomplicated: Secondary | ICD-10-CM | POA: Insufficient documentation

## 2017-09-13 DIAGNOSIS — Z79899 Other long term (current) drug therapy: Secondary | ICD-10-CM | POA: Diagnosis not present

## 2017-09-13 DIAGNOSIS — R42 Dizziness and giddiness: Secondary | ICD-10-CM | POA: Diagnosis present

## 2017-09-14 ENCOUNTER — Emergency Department
Admission: EM | Admit: 2017-09-14 | Discharge: 2017-09-14 | Disposition: A | Discharge status: Home / Self Care | Payer: Medicaid Other | Attending: Emergency Medicine | Admitting: Emergency Medicine

## 2017-09-14 ENCOUNTER — Emergency Department: Payer: Medicaid Other

## 2017-09-14 DIAGNOSIS — J209 Acute bronchitis, unspecified: Secondary | ICD-10-CM

## 2017-09-14 LAB — CBC
HCT: 33.5 % — ABNORMAL LOW (ref 35.0–47.0)
Hemoglobin: 10.7 g/dL — ABNORMAL LOW (ref 12.0–16.0)
MCH: 19.6 pg — AB (ref 26.0–34.0)
MCHC: 31.8 g/dL — ABNORMAL LOW (ref 32.0–36.0)
MCV: 61.7 fL — ABNORMAL LOW (ref 80.0–100.0)
PLATELETS: 336 10*3/uL (ref 150–440)
RBC: 5.43 MIL/uL — ABNORMAL HIGH (ref 3.80–5.20)
RDW: 19.5 % — AB (ref 11.5–14.5)
WBC: 9.9 10*3/uL (ref 3.6–11.0)

## 2017-09-14 LAB — COMPREHENSIVE METABOLIC PANEL
ALBUMIN: 3.6 g/dL (ref 3.5–5.0)
ALT: 19 U/L (ref 14–54)
AST: 20 U/L (ref 15–41)
Alkaline Phosphatase: 56 U/L (ref 38–126)
Anion gap: 9 (ref 5–15)
BUN: 13 mg/dL (ref 6–20)
CHLORIDE: 110 mmol/L (ref 101–111)
CO2: 21 mmol/L — AB (ref 22–32)
Calcium: 8.5 mg/dL — ABNORMAL LOW (ref 8.9–10.3)
Creatinine, Ser: 0.95 mg/dL (ref 0.44–1.00)
GFR calc Af Amer: >60 mL/min (ref >60)
GFR calc non Af Amer: >60 mL/min (ref >60)
GLUCOSE: 116 mg/dL — AB (ref 65–99)
POTASSIUM: 3.5 mmol/L (ref 3.5–5.1)
SODIUM: 140 mmol/L (ref 135–145)
TOTAL PROTEIN: 6.9 g/dL (ref 6.5–8.1)
Total Bilirubin: 0.4 mg/dL (ref 0.3–1.2)

## 2017-09-14 LAB — INFLUENZA PANEL BY PCR (TYPE A & B)
Influenza A By PCR: NEGATIVE
Influenza B By PCR: NEGATIVE

## 2017-09-14 LAB — GLUCOSE, CAPILLARY: GLUCOSE-CAPILLARY: 123 mg/dL — AB (ref 65–99)

## 2017-09-14 MED ORDER — BENZONATATE 100 MG PO CAPS
100.0000 mg | ORAL_CAPSULE | Freq: Once | ORAL | Status: AC
  Administered 2017-09-14: 100 mg via ORAL
  Filled 2017-09-14: qty 1

## 2017-09-14 MED ORDER — AZITHROMYCIN 500 MG PO TABS: 500.0000 mg | ORAL_TABLET | Freq: Every day | ORAL | 0 refills | Status: AC

## 2017-09-14 MED ORDER — AZITHROMYCIN 500 MG PO TABS
500.0000 mg | ORAL_TABLET | Freq: Once | ORAL | Status: AC
  Administered 2017-09-14: 500 mg via ORAL
  Filled 2017-09-14: qty 1

## 2017-09-14 MED ORDER — BENZONATATE 100 MG PO CAPS: 100.0000 mg | ORAL_CAPSULE | Freq: Two times a day (BID) | ORAL | 0 refills | Status: AC | PRN

## 2017-09-14 NOTE — ED Provider Notes (Signed)
[facility] Emergency Department Provider Note _   First MD Initiated Contact with Patient 09/14/17 0151     (approximate)  I have reviewed the triage vital signs and the nursing notes.   HISTORY  Chief Complaint Dizziness and Generalized Body Aches    HPI Donna Sanders is a 32 y.o. female with below list of chronic medical conditions including asthma presents to the emergency 2-day history of generalized muscle aches fatigue nonproductive cough.  Patient denies any known sick contact.  Patient denies any chest pain or fever afebrile on presentation.  Patient does admit to tobacco use occasionally.   Past Medical History:  Diagnosis Date  . Asthma   . Migraine   . Obesity     Patient Active Problem List   Diagnosis Date Noted  . Abscess 04/09/2013  . Backache 04/09/2013  . Excessive fetal growth affecting management of mother, antepartum 03/26/2013  . Allergic rhinitis, seasonal 12/20/2012    Past Surgical History:  Procedure Laterality Date  . ANKLE ARTHROPLASTY      Prior to Admission medications   Medication Sig Start Date End Date Taking? Authorizing Provider  albuterol (PROVENTIL HFA;VENTOLIN HFA) 108 (90 BASE) MCG/ACT inhaler Inhale 2 puffs into the lungs every 6 (six) hours as needed for wheezing.    [provider]  azithromycin (ZITHROMAX) 500 MG tablet Take 1 tablet (500 mg total) by mouth daily for 3 days. Take 1 tablet daily for 3 days. 09/14/17 09/17/17  Darci CurrentBrown, [place] N, MD  benzonatate (TESSALON PERLES) 100 MG capsule Take 1 capsule (100 mg total) by mouth 2 (two) times daily as needed for cough. 09/14/17 09/14/18  Darci CurrentBrown, [place] N, MD  butalbital-acetaminophen-caffeine (FIORICET) [phone] MG tablet Take 1-2 tablets by mouth every 6 (six) hours as needed for headache. 12/09/15   Beers, Charmayne Sheerharles M, PA-C  cyclobenzaprine (FLEXERIL) 10 MG tablet Take 1 tablet (10 mg total) by mouth 2 (two) times daily as needed for muscle  spasms. 04/30/17   Fayrene Helperran, [place], PA-C  diazepam (VALIUM) 5 MG tablet Take 1 tablet (5 mg total) by mouth every 6 (six) hours as needed for anxiety. 12/12/16   Triplett, Rulon Eisenmengerari B, FNP  ibuprofen (ADVIL,MOTRIN) 800 MG tablet Take 1 tablet (800 mg total) by mouth every 8 (eight) hours as needed for moderate pain. 04/30/17   Fayrene Helperran, [place], PA-C  methocarbamol (ROBAXIN) 500 MG tablet Take 2 tablets (1,000 mg total) by mouth 4 (four) times daily. 05/13/17   Renne CriglerGeiple, Joshua, PA-C  metoCLOPramide (REGLAN) 5 MG tablet Take 1 tablet (5 mg total) by mouth every 6 (six) hours as needed for nausea or vomiting. 03/01/16 03/01/17  Rockne MenghiniNorman, Anne-Caroline, MD  miconazole (EQ MICONAZOLE 7 DAY TREATMENT) 2 % vaginal cream Place 1 Applicatorful vaginally at bedtime. 01/04/16   Jeanmarie PlantMcShane, James A, MD  naproxen (NAPROSYN) 500 MG tablet Take 1 tablet (500 mg total) by mouth 2 (two) times daily. 05/13/17   Renne CriglerGeiple, Joshua, PA-C  tobramycin (TOBREX) 0.3 % ophthalmic solution 2 drops every 4 hours while awake to each eye 11/09/15   Tommi RumpsSummers, Rhonda L, PA-C  traMADol (ULTRAM) 50 MG tablet Take 1 tablet (50 mg total) by mouth every 6 (six) hours as needed. 12/12/16   Chinita Pesterriplett, Cari B, FNP    Allergies Penicillins  Family History  Problem Relation Age of Onset  . Asthma Mother   . Cancer Mother   . Depression Mother   . Migraines Mother     Social History Social History  Tobacco Use  . Smoking status: Current Some Day Smoker    Packs/day: 0.25  . Smokeless tobacco: Never Used  Substance Use Topics  . Alcohol use: Yes  . Drug use: No    Review of Systems Constitutional: No fever/chills Eyes: No visual changes. ENT: No sore throat.  Positive for nasal congestion Cardiovascular: Denies chest pain. Respiratory: Denies shortness of breath.  Positive for cough Gastrointestinal: No abdominal pain.  No nausea, no vomiting.  No diarrhea.  No constipation. Genitourinary: Negative for dysuria. Musculoskeletal: Negative for neck pain.   Negative for back pain.  Positive for generalized muscle aches Integumentary: Negative for rash. Neurological: Negative for headaches, focal weakness or numbness.   ____________________________________________   PHYSICAL EXAM:  VITAL SIGNS: ED Triage Vitals  Enc Vitals Group     BP 09/14/17 0005 128/86     Pulse Rate 09/14/17 0005 81     Resp 09/14/17 0005 18     Temp 09/14/17 0005 98.6 F (37 C)     Temp Source 09/14/17 0005 Oral     SpO2 09/14/17 0005 98 %     Weight 09/14/17 0003 113.4 kg (250 lb)     Height 09/14/17 0003 1.575 m (5\' 2" )     Head Circumference --      Peak Flow --      Pain Score 09/14/17 0002 8     Pain Loc --      Pain Edu? --      Excl. in GC? --     Constitutional: Alert and oriented. Well appearing and in no acute distress. Eyes: Conjunctivae are normal.  Head: Atraumatic. Mouth/Throat: Mucous membranes are moist.  Oropharynx non-erythematous. Neck: No stridor.   Cardiovascular: Normal rate, regular rhythm. Good peripheral circulation. Grossly normal heart sounds. Respiratory: Normal respiratory effort.  No retractions. Lungs CTAB. Gastrointestinal: Soft and nontender. No distention.  Musculoskeletal: No lower extremity tenderness nor edema. No gross deformities of extremities. Neurologic:  Normal speech and language. No gross focal neurologic deficits are appreciated.  Skin:  Skin is warm, dry and intact. No rash noted. Psychiatric: Mood and affect are normal. Speech and behavior are normal.  ____________________________________________   LABS (all labs ordered are listed, but only abnormal results are displayed)  Labs Reviewed  CBC - Abnormal; Notable for the following components:      Result Value   RBC 5.43 (*)    Hemoglobin 10.7 (*)    HCT 33.5 (*)    MCV 61.7 (*)    MCH 19.6 (*)    MCHC 31.8 (*)    RDW 19.5 (*)    All other components within normal limits  COMPREHENSIVE METABOLIC PANEL - Abnormal; Notable for the following  components:   CO2 21 (*)    Glucose, Bld 116 (*)    Calcium 8.5 (*)    All other components within normal limits  GLUCOSE, CAPILLARY - Abnormal; Notable for the following components:   Glucose-Capillary 123 (*)    All other components within normal limits  INFLUENZA PANEL BY PCR (TYPE A & B)  CBG MONITORING, ED   ____________________________________________  EKG  ED ECG REPORT I, [place] N BROWN, the attending physician, personally viewed and interpreted this ECG.   Date: 09/14/2017  EKG Time: 11:59 PM  Rate: 81  Rhythm: Normal sinus rhythm  Axis: Normal  Intervals: Normal  ST&T Change: None  ____________________________________________  RADIOLOGY I, [place] N BROWN, personally viewed and evaluated these images (plain radiographs) as part of  my medical decision making, as well as reviewing the written report by the radiologist.  Dg Chest 2 View  Result Date: 09/14/2017 CLINICAL DATA:  Initial evaluation for acute cough, dyspnea. EXAM: CHEST  2 VIEW COMPARISON:  Prior radiograph from 12/12/2016. FINDINGS: The cardiac and mediastinal silhouettes are stable in size and contour, and remain within normal limits. The lungs are mildly hypoinflated. The no airspace consolidation, pleural effusion, or pulmonary edema is identified. There is no pneumothorax. No acute osseous abnormality identified. IMPRESSION: No active cardiopulmonary disease. Electronically Signed   By: Rise Mu M.D.   On: 09/14/2017 02:23     Procedures   ____________________________________________   INITIAL IMPRESSION / ASSESSMENT AND PLAN / ED COURSE  As part of my medical decision making, I reviewed the following data within the electronic MEDICAL RECORD 30 year old female presented with above-stated history and physical exam.  Concern for possible influenza versus bronchitis versus pneumonia.  Operatory data revealed a negative influenza swab.  Chest x-ray revealed no evidence of pneumonia.   As such suspect bronchitis patient given Tessalon Perles anymore azithromycin as well. ____________________________________________  FINAL CLINICAL IMPRESSION(S) / ED DIAGNOSES  Final diagnoses:  Acute bronchitis, unspecified organism     MEDICATIONS GIVEN DURING THIS VISIT:  Medications  azithromycin (ZITHROMAX) tablet 500 mg (500 mg Oral Given 09/14/17 0239)  benzonatate (TESSALON) capsule 100 mg (100 mg Oral Given 09/14/17 0239)     ED Discharge Orders        Ordered    azithromycin (ZITHROMAX) 500 MG tablet  Daily     09/14/17 0229    benzonatate (TESSALON PERLES) 100 MG capsule  2 times daily PRN     09/14/17 0229       Note:  This document was prepared using Dragon voice recognition software and may include unintentional dictation errors.    Darci Current, MD 09/14/17 [id number]

## 2017-09-14 NOTE — ED Notes (Signed)
Patient unable to give urine sample at this time

## 2017-09-14 NOTE — ED Triage Notes (Signed)
Patient c/o generalized body aches, fatigue, and dizziness X 2 days. Patient denies N/V/D.

## 2018-08-08 ENCOUNTER — Emergency Department: Payer: Medicaid Other

## 2018-08-08 ENCOUNTER — Other Ambulatory Visit: Payer: Self-pay

## 2018-08-08 ENCOUNTER — Emergency Department
Admission: EM | Admit: 2018-08-08 | Discharge: 2018-08-08 | Disposition: A | Discharge status: Home / Self Care | Payer: Medicaid Other | Attending: Emergency Medicine | Admitting: Emergency Medicine

## 2018-08-08 ENCOUNTER — Encounter: Payer: Self-pay | Admitting: Emergency Medicine

## 2018-08-08 DIAGNOSIS — M79604 Pain in right leg: Secondary | ICD-10-CM | POA: Insufficient documentation

## 2018-08-08 DIAGNOSIS — F17210 Nicotine dependence, cigarettes, uncomplicated: Secondary | ICD-10-CM | POA: Insufficient documentation

## 2018-08-08 DIAGNOSIS — M79605 Pain in left leg: Secondary | ICD-10-CM | POA: Insufficient documentation

## 2018-08-08 DIAGNOSIS — R079 Chest pain, unspecified: Secondary | ICD-10-CM | POA: Insufficient documentation

## 2018-08-08 LAB — COMPREHENSIVE METABOLIC PANEL
ALK PHOS: 58 U/L (ref 38–126)
ALT: 25 U/L (ref 0–44)
AST: 24 U/L (ref 15–41)
Albumin: 3.8 g/dL (ref 3.5–5.0)
Anion gap: 6 (ref 5–15)
BUN: 17 mg/dL (ref 6–20)
CALCIUM: 8.8 mg/dL — AB (ref 8.9–10.3)
CO2: 23 mmol/L (ref 22–32)
CREATININE: 0.94 mg/dL (ref 0.44–1.00)
Chloride: 109 mmol/L (ref 98–111)
Glucose, Bld: 107 mg/dL — ABNORMAL HIGH (ref 70–99)
Potassium: 3.9 mmol/L (ref 3.5–5.1)
SODIUM: 138 mmol/L (ref 135–145)
Total Bilirubin: 0.5 mg/dL (ref 0.3–1.2)
Total Protein: 7.4 g/dL (ref 6.5–8.1)

## 2018-08-08 LAB — TROPONIN I: Troponin I: <0.03 ng/mL (ref <0.03)

## 2018-08-08 LAB — CBC WITH DIFFERENTIAL/PLATELET
ABS IMMATURE GRANULOCYTES: 0.02 10*3/uL (ref 0.00–0.07)
Basophils Absolute: 0 10*3/uL (ref 0.0–0.1)
Basophils Relative: 0 %
Eosinophils Absolute: 0.1 10*3/uL (ref 0.0–0.5)
Eosinophils Relative: 1 %
HCT: 35.1 % — ABNORMAL LOW (ref 36.0–46.0)
HEMOGLOBIN: 10.7 g/dL — AB (ref 12.0–15.0)
Immature Granulocytes: 0 %
LYMPHS PCT: 27 %
Lymphs Abs: 2.5 10*3/uL (ref 0.7–4.0)
MCH: 19.7 pg — AB (ref 26.0–34.0)
MCHC: 30.5 g/dL (ref 30.0–36.0)
MCV: 64.5 fL — AB (ref 80.0–100.0)
MONO ABS: 0.9 10*3/uL (ref 0.1–1.0)
MONOS PCT: 10 %
Neutro Abs: 5.8 10*3/uL (ref 1.7–7.7)
Neutrophils Relative %: 62 %
Platelets: 293 10*3/uL (ref 150–400)
RBC: 5.44 MIL/uL — ABNORMAL HIGH (ref 3.87–5.11)
RDW: 18.6 % — ABNORMAL HIGH (ref 11.5–15.5)
WBC: 9.3 10*3/uL (ref 4.0–10.5)
nRBC: 0 % (ref 0.0–0.2)

## 2018-08-08 LAB — FIBRIN DERIVATIVES D-DIMER (ARMC ONLY): FIBRIN DERIVATIVES D-DIMER (ARMC): 579.15 ng{FEU}/mL — AB (ref 0.00–499.00)

## 2018-08-08 MED ORDER — IOPAMIDOL (ISOVUE-370) INJECTION 76%
75.0000 mL | Freq: Once | INTRAVENOUS | Status: AC | PRN
  Administered 2018-08-08: 75 mL via INTRAVENOUS

## 2018-08-08 MED ORDER — DIAZEPAM 5 MG PO TABS: 5.0000 mg | ORAL_TABLET | Freq: Three times a day (TID) | ORAL | 0 refills | Status: DC | PRN

## 2018-08-08 MED ORDER — IBUPROFEN 800 MG PO TABS
800.0000 mg | ORAL_TABLET | Freq: Once | ORAL | Status: AC
  Administered 2018-08-08: 800 mg via ORAL
  Filled 2018-08-08: qty 1

## 2018-08-08 MED ORDER — IBUPROFEN 600 MG PO TABS: 600.0000 mg | ORAL_TABLET | Freq: Three times a day (TID) | ORAL | 0 refills | Status: DC | PRN

## 2018-08-08 NOTE — ED Notes (Signed)
Pt presents to ED from home with c/c of bilateral leg pain and L breast pain. Pt states that leg pain began 3 days ago and has gotten progressively worse. Pt has Hx of clot in L leg from surgery several years ago. Pt reports being on blood thinners after that but is no longer taking them. Pt describes L breast pain as chronic and intermittent. It has been "coming and going for weeks". Pain described as sharp, located in the center of her breast below the nipple. Pt denies any discharge/drainage. EDP notified of pt concerns.

## 2018-08-08 NOTE — ED Provider Notes (Signed)
[facility] Emergency Department Provider Note       Time seen: ----------------------------------------- 8:59 AM on 08/08/2018 -----------------------------------------   I have reviewed the triage vital signs and the nursing notes.  HISTORY   Chief Complaint Leg Pain and Breast Pain    HPI Donna Sanders is a 32 y.o. female with a history of asthma, migraines and obesity who presents to the ED for bilateral leg pain since yesterday.  Patient also describes some sharp left-sided breast pain that comes and goes, usually when she is relaxing.  She does report a history of DVT in her right leg in the distant past.  She denies any recent illness or other complaints.  Past Medical History:  Diagnosis Date  . Asthma   . Migraine   . Obesity     Patient Active Problem List   Diagnosis Date Noted  . Abscess 04/09/2013  . Backache 04/09/2013  . Excessive fetal growth affecting management of mother, antepartum 03/26/2013  . Allergic rhinitis, seasonal 12/20/2012    Past Surgical History:  Procedure Laterality Date  . ANKLE ARTHROPLASTY      Allergies Penicillins  Social History Social History   Tobacco Use  . Smoking status: Current Some Day Smoker    Packs/day: 0.25  . Smokeless tobacco: Never Used  Substance Use Topics  . Alcohol use: Yes  . Drug use: No   Review of Systems Constitutional: Negative for fever. Cardiovascular: Positive for chest pain Respiratory: Negative for shortness of breath. Gastrointestinal: Negative for abdominal pain, vomiting and diarrhea. Musculoskeletal: Positive for leg pain Skin: Negative for rash. Neurological: Negative for headaches, focal weakness or numbness.  All systems negative/normal/unremarkable except as stated in the HPI  ____________________________________________   PHYSICAL EXAM:  VITAL SIGNS: ED Triage Vitals  Enc Vitals Group     BP 08/08/18 0849 111/90     Pulse Rate 08/08/18 0849  77     Resp 08/08/18 0849 18     Temp 08/08/18 0849 98.7 F (37.1 C)     Temp Source 08/08/18 0849 Oral     SpO2 08/08/18 0849 99 %     Weight 08/08/18 0850 230 lb (104.3 kg)     Height 08/08/18 0850 5\' 2"  (1.575 m)     Head Circumference --      Peak Flow --      Pain Score 08/08/18 0849 7     Pain Loc --      Pain Edu? --      Excl. in GC? --    Constitutional: Alert and oriented. Well appearing and in no distress. Eyes: Conjunctivae are normal. Normal extraocular movements. ENT   Head: Normocephalic and atraumatic.   Nose: No congestion/rhinnorhea.   Mouth/Throat: Mucous membranes are moist.   Neck: No stridor. Cardiovascular: Normal rate, regular rhythm. No murmurs, rubs, or gallops. Respiratory: Normal respiratory effort without tachypnea nor retractions. Breath sounds are clear and equal bilaterally. No wheezes/rales/rhonchi. Gastrointestinal: Soft and nontender. Normal bowel sounds Musculoskeletal: Nontender with normal range of motion in extremities. No lower extremity tenderness nor edema. Neurologic:  Normal speech and language. No gross focal neurologic deficits are appreciated.  Skin:  Skin is warm, dry and intact. No rash noted. Psychiatric: Mood and affect are normal. Speech and behavior are normal.  ____________________________________________  EKG: Interpreted by me.  Sinus rhythm with a rate of 76 bpm, normal PR interval, normal QRS, normal QT  ____________________________________________  ED COURSE:  As part of my medical decision  making, I reviewed the following data within the electronic MEDICAL RECORD NUMBER History obtained from family if available, nursing notes, old chart and ekg, as well as notes from prior ED visits. Patient presented for leg pain and chest pain, we will assess with labs and imaging as indicated at this time.   Procedures ____________________________________________   LABS (pertinent positives/negatives)  Labs Reviewed   CBC WITH DIFFERENTIAL/PLATELET - Abnormal; Notable for the following components:      Result Value   RBC 5.44 (*)    Hemoglobin 10.7 (*)    HCT 35.1 (*)    MCV 64.5 (*)    MCH 19.7 (*)    RDW 18.6 (*)    All other components within normal limits  COMPREHENSIVE METABOLIC PANEL - Abnormal; Notable for the following components:   Glucose, Bld 107 (*)    Calcium 8.8 (*)    All other components within normal limits  FIBRIN DERIVATIVES D-DIMER ([facility] ONLY) - Abnormal; Notable for the following components:   Fibrin derivatives D-dimer ([facility]) 579.15 (*)    All other components within normal limits  TROPONIN I  POC URINE PREG, ED    RADIOLOGY Images were viewed by me  Chest x-ray  IMPRESSION: 1. No evidence pulmonary embolus. ____________________________________________  DIFFERENTIAL DIAGNOSIS    Musculoskeletal pain, GERD, anxiety, PE, DVT   FINAL ASSESSMENT AND PLAN  Nonspecific chest pain, leg pain   Plan: The patient had presented for chest and leg pain. Patient's labs did not reveal any acute abnormality.  D-dimer was marginally elevated, patient's imaging was negative for PE.  She had requested ultrasound to evaluate for DVT which was also negative.  She is cleared for outpatient follow-up.   Ulice Dash, MD   Note: This note was generated in part or whole with voice recognition software. Voice recognition is usually quite accurate but there are transcription errors that can and very often do occur. I apologize for any typographical errors that were not detected and corrected.     Emily Filbert, MD 08/08/18 [phone]

## 2018-08-08 NOTE — ED Notes (Signed)
Pt in NAD and ambulatory at time of d/c. 

## 2018-08-08 NOTE — ED Notes (Signed)
Pt asked to use restroom for urine sample. Pt refuses to attempt, stating "I don't have to go". Pt acknowledges need for sample ordered by EDP.

## 2018-08-08 NOTE — ED Triage Notes (Signed)
Pt here with c/o bilateral leg pain since yesterday. Also c/o sharp left breast pain that comes and goes, usually comes on when she is relaxing, appears in NAD.

## 2019-04-17 ENCOUNTER — Ambulatory Visit: Payer: Medicaid Other | Admitting: Obstetrics & Gynecology

## 2019-05-02 ENCOUNTER — Ambulatory Visit: Payer: Medicaid Other | Admitting: Family Medicine

## 2019-05-02 ENCOUNTER — Other Ambulatory Visit (HOSPITAL_COMMUNITY)
Admission: RE | Admit: 2019-05-02 | Discharge: 2019-05-02 | Disposition: A | Discharge status: Home / Self Care | Payer: Medicaid Other | Source: Ambulatory Visit | Attending: Family Medicine | Admitting: Family Medicine

## 2019-05-02 ENCOUNTER — Other Ambulatory Visit: Payer: Self-pay

## 2019-05-02 ENCOUNTER — Encounter: Payer: Self-pay | Admitting: Family Medicine

## 2019-05-02 VITALS — BP 119/82 | HR 91 | Temp 98.1°F | Ht 62.0 in | Wt 275.7 lb

## 2019-05-02 DIAGNOSIS — Z6841 Body Mass Index (BMI) 40.0 and over, adult: Secondary | ICD-10-CM | POA: Diagnosis not present

## 2019-05-02 DIAGNOSIS — Z0000 Encounter for general adult medical examination without abnormal findings: Secondary | ICD-10-CM

## 2019-05-02 DIAGNOSIS — L0291 Cutaneous abscess, unspecified: Secondary | ICD-10-CM

## 2019-05-02 DIAGNOSIS — Z01419 Encounter for gynecological examination (general) (routine) without abnormal findings: Secondary | ICD-10-CM | POA: Diagnosis not present

## 2019-05-02 DIAGNOSIS — N760 Acute vaginitis: Secondary | ICD-10-CM | POA: Diagnosis not present

## 2019-05-02 DIAGNOSIS — B9689 Other specified bacterial agents as the cause of diseases classified elsewhere: Secondary | ICD-10-CM | POA: Insufficient documentation

## 2019-05-02 MED ORDER — FLUCONAZOLE 150 MG PO TABS: 150.0000 mg | ORAL_TABLET | Freq: Once | ORAL | 3 refills | Status: AC

## 2019-05-02 MED ORDER — METRONIDAZOLE 0.75 % VA GEL: 1.0000 | Freq: Every day | VAGINAL | 5 refills | Status: DC

## 2019-05-02 MED ORDER — SULFAMETHOXAZOLE-TRIMETHOPRIM 800-160 MG PO TABS: 1.0000 | ORAL_TABLET | Freq: Two times a day (BID) | ORAL | 1 refills | Status: DC

## 2019-05-02 NOTE — Patient Instructions (Signed)
[place] [address] [place],  [place]  [address]  Main: [phone]

## 2019-05-02 NOTE — Progress Notes (Signed)
GYNECOLOGY ANNUAL PREVENTATIVE CARE ENCOUNTER NOTE  Subjective:   Donna Sanders is a 33 y.o. [phone] [id number] female here for a routine annual gynecologic exam.  Current complaints: recurrent BV every other month. Also has left axillary abscess.   Denies abnormal vaginal bleeding, discharge, pelvic pain, problems with intercourse or other gynecologic concerns.    Gynecologic History Patient's last menstrual period was 04/14/2019. Patient is sexually active  Contraception: condoms Last Pap: 3-5 years ago. Results were: normal Last mammogram: n/a Obstetric History OB History  Gravida Para Term Preterm AB Living  4 4 4     4   SAB TAB Ectopic Multiple Live Births          4    # Outcome Date GA Lbr Len/2nd Weight Sex Delivery Anes PTL Lv  4 Term 05/30/13 7163w0d   F Vag-Spont None N LIV     Birth Comments: System Generated. Please review and update pregnancy details.  3 Term 03/30/06 263w0d   F Vag-Spont EPI  LIV  2 Term 09/13/04 9463w0d   M Vag-Spont None  LIV  1 Term 09/16/03 1863w0d   F Vag-Spont None  LIV    Past Medical History:  Diagnosis Date  . Asthma   . Migraine   . Obesity     Past Surgical History:  Procedure Laterality Date  . ANKLE ARTHROPLASTY      Current Outpatient Medications on File Prior to Visit  Medication Sig Dispense Refill  . albuterol (PROVENTIL HFA;VENTOLIN HFA) 108 (90 BASE) MCG/ACT inhaler Inhale 2 puffs into the lungs every 6 (six) hours as needed for wheezing.    . butalbital-acetaminophen-caffeine (FIORICET) 50-325-40 MG tablet Take 1-2 tablets by mouth every 6 (six) hours as needed for headache. (Patient not taking: Reported on 08/08/2018) 20 tablet 0  . cyclobenzaprine (FLEXERIL) 10 MG tablet Take 1 tablet (10 mg total) by mouth 2 (two) times daily as needed for muscle spasms. (Patient not taking: Reported on 08/08/2018) 20 tablet 0  . diazepam (VALIUM) 5 MG tablet Take 1 tablet (5 mg total) by mouth every 6 (six) hours as needed for anxiety. (Patient  not taking: Reported on 08/08/2018) 12 tablet 0  . diazepam (VALIUM) 5 MG tablet Take 1 tablet (5 mg total) by mouth every 8 (eight) hours as needed for muscle spasms. 20 tablet 0  . ibuprofen (ADVIL,MOTRIN) 600 MG tablet Take 1 tablet (600 mg total) by mouth every 8 (eight) hours as needed. 30 tablet 0  . ibuprofen (ADVIL,MOTRIN) 800 MG tablet Take 1 tablet (800 mg total) by mouth every 8 (eight) hours as needed for moderate pain. (Patient not taking: Reported on 08/08/2018) 30 tablet 0  . methocarbamol (ROBAXIN) 500 MG tablet Take 2 tablets (1,000 mg total) by mouth 4 (four) times daily. (Patient not taking: Reported on 08/08/2018) 20 tablet 0  . metoCLOPramide (REGLAN) 5 MG tablet Take 1 tablet (5 mg total) by mouth every 6 (six) hours as needed for nausea or vomiting. 12 tablet 0  . miconazole (EQ MICONAZOLE 7 DAY TREATMENT) 2 % vaginal cream Place 1 Applicatorful vaginally at bedtime. (Patient not taking: Reported on 08/08/2018) 45 g 0  . naproxen (NAPROSYN) 500 MG tablet Take 1 tablet (500 mg total) by mouth 2 (two) times daily. (Patient not taking: Reported on 08/08/2018) 20 tablet 0  . tobramycin (TOBREX) 0.3 % ophthalmic solution 2 drops every 4 hours while awake to each eye (Patient not taking: Reported on 08/08/2018) 5 mL 0  . traMADol (  ULTRAM) 50 MG tablet Take 1 tablet (50 mg total) by mouth every 6 (six) hours as needed. (Patient not taking: Reported on 08/08/2018) 12 tablet 0   No current facility-administered medications on file prior to visit.     Allergies  Allergen Reactions  . Penicillins Hives, Shortness Of Breath, Itching and Swelling    Throat swelling    Social History   Socioeconomic History  . Marital status: Single    Spouse name: Not on file  . Number of children: Not on file  . Years of education: Not on file  . Highest education level: Not on file  Occupational History  . Not on file  Social Needs  . Financial resource strain: Not on file  . Food  insecurity    Worry: Not on file    Inability: Not on file  . Transportation needs    Medical: Not on file    Non-medical: Not on file  Tobacco Use  . Smoking status: Current Some Day Smoker    Types: Cigarettes  . Smokeless tobacco: Never Used  . Tobacco comment: 1 pack every 3 days   Substance and Sexual Activity  . Alcohol use: Yes  . Drug use: No  . Sexual activity: Yes    Birth control/protection: None  Lifestyle  . Physical activity    Days per week: Not on file    Minutes per session: Not on file  . Stress: Not on file  Relationships  . Social Herbalist on phone: Not on file    Gets together: Not on file    Attends religious service: Not on file    Active member of club or organization: Not on file    Attends meetings of clubs or organizations: Not on file    Relationship status: Not on file  . Intimate partner violence    Fear of current or ex partner: Not on file    Emotionally abused: Not on file    Physically abused: Not on file    Forced sexual activity: Not on file  Other Topics Concern  . Not on file  Social History Narrative  . Not on file    Family History  Problem Relation Age of Onset  . Asthma Mother   . Cancer Mother   . Depression Mother   . Migraines Mother     The following portions of the patient's history were reviewed and updated as appropriate: allergies, current medications, past family history, past medical history, past social history, past surgical history and problem list.  Review of Systems Pertinent items are noted in HPI.   Objective:  BP 119/82   Pulse 91   Temp 98.1 F (36.7 C)   Ht 5\' 2"  (1.575 m)   Wt 275 lb 11.2 oz (125.1 kg)   LMP 04/14/2019   BMI 50.43 kg/m  Wt Readings from Last 3 Encounters:  05/02/19 275 lb 11.2 oz (125.1 kg)  08/08/18 230 lb (104.3 kg)  09/14/17 250 lb (113.4 kg)     CONSTITUTIONAL: Well-developed, well-nourished female in no acute distress.  HENT:  Normocephalic,  atraumatic, External right and left ear normal. Oropharynx is clear and moist EYES: Conjunctivae and EOM are normal. Pupils are equal, round, and reactive to light. No scleral icterus.  NECK: Normal range of motion, supple, no masses.  Normal thyroid.   CARDIOVASCULAR: Normal heart rate noted, regular rhythm RESPIRATORY: Clear to auscultation bilaterally. Effort and breath sounds normal, no problems with respiration  noted. BREASTS: declined. ABDOMEN: Soft, normal bowel sounds, no distention noted.  No tenderness, rebound or guarding.  PELVIC: Normal appearing external genitalia; normal appearing vaginal mucosa and cervix.  No abnormal discharge noted.  Normal uterine size, no other palpable masses, no uterine or adnexal tenderness. MUSCULOSKELETAL: Normal range of motion. No tenderness.  No cyanosis, clubbing, or edema.  2+ distal pulses. SKIN: Skin is warm and dry. No rash noted. Not diaphoretic. No erythema. No pallor. NEUROLOGIC: Alert and oriented to person, place, and time. Normal reflexes, muscle tone coordination. No cranial nerve deficit noted. PSYCHIATRIC: Normal mood and affect. Normal behavior. Normal judgment and thought content.  Assessment:  Annual gynecologic examination with pap smear   Plan:  1. Well Woman Exam Will follow up results of pap smear and manage accordingly. STD testing discussed. Patient requested testing - Hepatitis B surface antigen - Hepatitis C antibody - HIV Antibody (routine testing w rflx) - RPR - Cervicovaginal ancillary only( [place]) - Cytology - PAP( [facility])  2. BV (bacterial vaginosis) Metrogel x 10 days, then twice a week for 6 months for suppresssion  3. Abscess Bactrim. Diflucan for yeast infection prevention.  4. OBesity Discussed referral to Obesity Medicine. Will need to establish with PCP and get referral due to insurance.  Routine preventative health maintenance measures emphasized. Please refer to After Visit Summary  for other counseling recommendations.    Candelaria Celeste, DO [facility] [employer]

## 2019-05-02 NOTE — Progress Notes (Signed)
Pt presents for annual and c/o recurrent BV infections.  Pt requests ABx for draining boil L axilla.

## 2019-05-03 LAB — RPR: RPR Ser Ql: NONREACTIVE

## 2019-05-03 LAB — HIV ANTIBODY (ROUTINE TESTING W REFLEX): HIV Screen 4th Generation wRfx: NONREACTIVE

## 2019-05-03 LAB — HEPATITIS C ANTIBODY: Hep C Virus Ab: <0.1 s/co ratio (ref 0.0–0.9)

## 2019-05-03 LAB — HEPATITIS B SURFACE ANTIGEN: Hepatitis B Surface Ag: NEGATIVE

## 2019-05-06 LAB — CYTOLOGY - PAP
Diagnosis: NEGATIVE
HPV: NOT DETECTED

## 2019-05-07 ENCOUNTER — Telehealth: Payer: Self-pay

## 2019-05-07 LAB — CERVICOVAGINAL ANCILLARY ONLY
Bacterial vaginitis: POSITIVE — AB
Candida vaginitis: NEGATIVE
Chlamydia: NEGATIVE
Neisseria Gonorrhea: NEGATIVE
Trichomonas: NEGATIVE

## 2019-05-07 NOTE — Telephone Encounter (Signed)
Attempted to reach pt again to discuss results  Pt not ava left detailed message for pt to call.

## 2019-05-09 ENCOUNTER — Other Ambulatory Visit: Payer: Self-pay | Admitting: Family Medicine

## 2019-05-09 MED ORDER — METRONIDAZOLE 500 MG PO TABS: 500.0000 mg | ORAL_TABLET | Freq: Two times a day (BID) | ORAL | 0 refills | Status: DC

## 2019-07-10 ENCOUNTER — Emergency Department (HOSPITAL_COMMUNITY)
Admission: EM | Admit: 2019-07-10 | Discharge: 2019-07-10 | Disposition: A | Discharge status: Home / Self Care | Payer: No Typology Code available for payment source | Attending: Emergency Medicine | Admitting: Emergency Medicine

## 2019-07-10 ENCOUNTER — Emergency Department (HOSPITAL_COMMUNITY): Payer: No Typology Code available for payment source

## 2019-07-10 ENCOUNTER — Other Ambulatory Visit: Payer: Self-pay

## 2019-07-10 ENCOUNTER — Encounter (HOSPITAL_COMMUNITY): Payer: Self-pay

## 2019-07-10 DIAGNOSIS — J45909 Unspecified asthma, uncomplicated: Secondary | ICD-10-CM | POA: Diagnosis not present

## 2019-07-10 DIAGNOSIS — Z79899 Other long term (current) drug therapy: Secondary | ICD-10-CM | POA: Diagnosis not present

## 2019-07-10 DIAGNOSIS — M542 Cervicalgia: Secondary | ICD-10-CM | POA: Diagnosis present

## 2019-07-10 DIAGNOSIS — M79602 Pain in left arm: Secondary | ICD-10-CM | POA: Insufficient documentation

## 2019-07-10 DIAGNOSIS — M549 Dorsalgia, unspecified: Secondary | ICD-10-CM | POA: Diagnosis not present

## 2019-07-10 DIAGNOSIS — F17210 Nicotine dependence, cigarettes, uncomplicated: Secondary | ICD-10-CM | POA: Diagnosis not present

## 2019-07-10 MED ORDER — HYDROCODONE-ACETAMINOPHEN 5-325 MG PO TABS
1.0000 | ORAL_TABLET | Freq: Once | ORAL | Status: AC
  Administered 2019-07-10: 1 via ORAL
  Filled 2019-07-10: qty 1

## 2019-07-10 NOTE — Discharge Instructions (Addendum)
Please return for any problem.  Follow-up with your regular care provider as instructed.    Take ibuprofen --600 mg taken orally every 8 hours -for pain.
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
## 2019-07-10 NOTE — ED Notes (Signed)
Pt calling ride.

## 2019-07-13 ENCOUNTER — Other Ambulatory Visit: Payer: Self-pay

## 2019-07-13 ENCOUNTER — Emergency Department (HOSPITAL_COMMUNITY)
Admission: EM | Admit: 2019-07-13 | Discharge: 2019-07-13 | Disposition: A | Discharge status: Home / Self Care | Payer: Medicaid Other | Attending: Emergency Medicine | Admitting: Emergency Medicine

## 2019-07-13 DIAGNOSIS — F17210 Nicotine dependence, cigarettes, uncomplicated: Secondary | ICD-10-CM | POA: Diagnosis not present

## 2019-07-13 DIAGNOSIS — J45909 Unspecified asthma, uncomplicated: Secondary | ICD-10-CM | POA: Insufficient documentation

## 2019-07-13 DIAGNOSIS — S46912A Strain of unspecified muscle, fascia and tendon at shoulder and upper arm level, left arm, initial encounter: Secondary | ICD-10-CM

## 2019-07-13 DIAGNOSIS — Y92410 Unspecified street and highway as the place of occurrence of the external cause: Secondary | ICD-10-CM | POA: Insufficient documentation

## 2019-07-13 DIAGNOSIS — Y939 Activity, unspecified: Secondary | ICD-10-CM | POA: Diagnosis not present

## 2019-07-13 DIAGNOSIS — Y999 Unspecified external cause status: Secondary | ICD-10-CM | POA: Insufficient documentation

## 2019-07-13 DIAGNOSIS — S46812A Strain of other muscles, fascia and tendons at shoulder and upper arm level, left arm, initial encounter: Secondary | ICD-10-CM | POA: Insufficient documentation

## 2019-07-13 DIAGNOSIS — S4992XA Unspecified injury of left shoulder and upper arm, initial encounter: Secondary | ICD-10-CM | POA: Diagnosis present

## 2019-07-13 MED ORDER — ACETAMINOPHEN 500 MG PO TABS
1000.0000 mg | ORAL_TABLET | Freq: Once | ORAL | Status: AC
  Administered 2019-07-13: 1000 mg via ORAL
  Filled 2019-07-13: qty 2

## 2019-07-13 MED ORDER — METHOCARBAMOL 750 MG PO TABS: 750.0000 mg | ORAL_TABLET | Freq: Three times a day (TID) | ORAL | 0 refills | Status: DC | PRN
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
## 2019-07-13 NOTE — Discharge Instructions (Addendum)
It was our pleasure to provide your ER care today - we hope that you feel better.  Take ibuprofen or acetaminophen as need for pain.   You may take robaxin as need for muscle pain/spasm - no driving when taking.  Follow up with primary care doctor in 1 week if symptoms fail to improve/resolve.  Return to ER if worse, new or severe pain, or other concern.
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
## 2019-09-16 ENCOUNTER — Other Ambulatory Visit: Payer: Self-pay

## 2019-09-16 ENCOUNTER — Emergency Department (HOSPITAL_COMMUNITY)
Admission: EM | Admit: 2019-09-16 | Discharge: 2019-09-16 | Disposition: A | Discharge status: Home / Self Care | Payer: Medicaid Other | Attending: Emergency Medicine | Admitting: Emergency Medicine

## 2019-09-16 ENCOUNTER — Emergency Department (HOSPITAL_COMMUNITY): Payer: Medicaid Other

## 2019-09-16 ENCOUNTER — Encounter (HOSPITAL_COMMUNITY): Payer: Self-pay

## 2019-09-16 DIAGNOSIS — Z79899 Other long term (current) drug therapy: Secondary | ICD-10-CM | POA: Insufficient documentation

## 2019-09-16 DIAGNOSIS — R112 Nausea with vomiting, unspecified: Secondary | ICD-10-CM | POA: Diagnosis present

## 2019-09-16 DIAGNOSIS — U071 COVID-19: Secondary | ICD-10-CM | POA: Insufficient documentation

## 2019-09-16 DIAGNOSIS — F17210 Nicotine dependence, cigarettes, uncomplicated: Secondary | ICD-10-CM | POA: Insufficient documentation

## 2019-09-16 DIAGNOSIS — J45909 Unspecified asthma, uncomplicated: Secondary | ICD-10-CM | POA: Insufficient documentation

## 2019-09-16 LAB — URINALYSIS, ROUTINE W REFLEX MICROSCOPIC
Bilirubin Urine: NEGATIVE
Glucose, UA: NEGATIVE mg/dL
Hgb urine dipstick: NEGATIVE
Ketones, ur: NEGATIVE mg/dL
Leukocytes,Ua: NEGATIVE
Nitrite: NEGATIVE
Protein, ur: NEGATIVE mg/dL
Specific Gravity, Urine: 1.016 (ref 1.005–1.030)
pH: 5 (ref 5.0–8.0)

## 2019-09-16 LAB — RESPIRATORY PANEL BY RT PCR (FLU A&B, COVID)
Influenza A by PCR: NEGATIVE
Influenza B by PCR: NEGATIVE
SARS Coronavirus 2 by RT PCR: POSITIVE — AB

## 2019-09-16 LAB — COMPREHENSIVE METABOLIC PANEL
ALT: 37 U/L (ref 0–44)
AST: 23 U/L (ref 15–41)
Albumin: 3.7 g/dL (ref 3.5–5.0)
Alkaline Phosphatase: 61 U/L (ref 38–126)
Anion gap: 8 (ref 5–15)
BUN: 14 mg/dL (ref 6–20)
CO2: 20 mmol/L — ABNORMAL LOW (ref 22–32)
Calcium: 8.5 mg/dL — ABNORMAL LOW (ref 8.9–10.3)
Chloride: 110 mmol/L (ref 98–111)
Creatinine, Ser: 0.78 mg/dL (ref 0.44–1.00)
GFR calc Af Amer: >60 mL/min (ref >60)
GFR calc non Af Amer: >60 mL/min (ref >60)
Glucose, Bld: 154 mg/dL — ABNORMAL HIGH (ref 70–99)
Potassium: 4.1 mmol/L (ref 3.5–5.1)
Sodium: 138 mmol/L (ref 135–145)
Total Bilirubin: 0.4 mg/dL (ref 0.3–1.2)
Total Protein: 7.4 g/dL (ref 6.5–8.1)

## 2019-09-16 LAB — CBC WITH DIFFERENTIAL/PLATELET
Abs Immature Granulocytes: 0.02 10*3/uL (ref 0.00–0.07)
Basophils Absolute: 0 10*3/uL (ref 0.0–0.1)
Basophils Relative: 0 %
Eosinophils Absolute: 0.1 10*3/uL (ref 0.0–0.5)
Eosinophils Relative: 1 %
HCT: 37.7 % (ref 36.0–46.0)
Hemoglobin: 11.5 g/dL — ABNORMAL LOW (ref 12.0–15.0)
Immature Granulocytes: 0 %
Lymphocytes Relative: 18 %
Lymphs Abs: 1.5 10*3/uL (ref 0.7–4.0)
MCH: 20.1 pg — ABNORMAL LOW (ref 26.0–34.0)
MCHC: 30.5 g/dL (ref 30.0–36.0)
MCV: 65.9 fL — ABNORMAL LOW (ref 80.0–100.0)
Monocytes Absolute: 0.8 10*3/uL (ref 0.1–1.0)
Monocytes Relative: 9 %
Neutro Abs: 5.9 10*3/uL (ref 1.7–7.7)
Neutrophils Relative %: 72 %
Platelets: 279 10*3/uL (ref 150–400)
RBC: 5.72 MIL/uL — ABNORMAL HIGH (ref 3.87–5.11)
RDW: 19.7 % — ABNORMAL HIGH (ref 11.5–15.5)
WBC: 8.3 10*3/uL (ref 4.0–10.5)
nRBC: 0 % (ref 0.0–0.2)

## 2019-09-16 LAB — I-STAT BETA HCG BLOOD, ED (MC, WL, AP ONLY): I-stat hCG, quantitative: <5 m[IU]/mL (ref <5)

## 2019-09-16 LAB — LIPASE, BLOOD: Lipase: 28 U/L (ref 11–51)

## 2019-09-16 MED ORDER — LIDOCAINE VISCOUS HCL 2 % MT SOLN
15.0000 mL | Freq: Once | OROMUCOSAL | Status: AC
  Administered 2019-09-16: 15 mL via ORAL
  Filled 2019-09-16: qty 15

## 2019-09-16 MED ORDER — KETOROLAC TROMETHAMINE 30 MG/ML IJ SOLN
30.0000 mg | Freq: Once | INTRAMUSCULAR | Status: AC
  Administered 2019-09-16: 30 mg via INTRAVENOUS
  Filled 2019-09-16: qty 1

## 2019-09-16 MED ORDER — SODIUM CHLORIDE 0.9 % IV BOLUS
1000.0000 mL | Freq: Once | INTRAVENOUS | Status: AC
  Administered 2019-09-16: 1000 mL via INTRAVENOUS

## 2019-09-16 MED ORDER — ONDANSETRON 4 MG PO TBDP: 4.0000 mg | ORAL_TABLET | Freq: Three times a day (TID) | ORAL | 0 refills | Status: DC | PRN

## 2019-09-16 MED ORDER — ALUM & MAG HYDROXIDE-SIMETH 200-200-20 MG/5ML PO SUSP
30.0000 mL | Freq: Once | ORAL | Status: AC
  Administered 2019-09-16: 30 mL via ORAL
  Filled 2019-09-16: qty 30

## 2019-09-16 MED ORDER — ONDANSETRON HCL 4 MG/2ML IJ SOLN
4.0000 mg | Freq: Once | INTRAMUSCULAR | Status: AC
  Administered 2019-09-16: 10:00:00 4 mg via INTRAVENOUS
  Filled 2019-09-16: qty 2

## 2019-09-16 NOTE — Discharge Instructions (Signed)
Your Covid test is positive.  Take the nausea medication as prescribed.  Keep yourself quarantined and out of work and out of school while you are sick.  Return to the ED if you develop new or worsening symptoms.

## 2019-09-16 NOTE — ED Notes (Signed)
Date and time results received: 09/16/19 1204 (use smartphrase ".now" to insert current time)  Test: Covid Critical Value: Covid +  Name of Provider Notified: Rancour   Orders Received? Or Actions Taken?: none at this time

## 2019-09-16 NOTE — ED Provider Notes (Signed)
[facility]-EMERGENCY DEPT Provider Note   CSN: [id number] Arrival date & time: 09/16/19  0846     History Chief Complaint  Patient presents with  . Emesis    Donna Sanders is a 34 y.o. female.  Patient awoke this morning with vomiting x2.  States she felt well when she went to bed.  States she vomited after coughing.  Emesis was clear and nonbloody and nonbilious.  She is a cramping upper abdominal pain since.  She does work at [employer] and has had multiple coronavirus exposures.  She endorses a mild headache and pain in her upper abdomen with nausea and vomiting.  There is no chest pain or shortness of breath.  No fever.  No pain with urination or blood in the urine.  Denies any possibility of pregnancy. No vaginal bleeding or discharge. No previous abdominal surgeries or stomach issues.  She felt well when she went to bed.  Only other medical problem is hypertension.  The history is provided by the patient.  Emesis Associated symptoms: abdominal pain, arthralgias and myalgias   Associated symptoms: no cough, no fever and no headaches        Past Medical History:  Diagnosis Date  . Asthma   . Migraine   . Obesity     Patient Active Problem List   Diagnosis Date Noted  . Abscess 04/09/2013  . Backache 04/09/2013  . Excessive fetal growth affecting management of mother, antepartum 03/26/2013  . Allergic rhinitis, seasonal 12/20/2012    Past Surgical History:  Procedure Laterality Date  . ANKLE ARTHROPLASTY       OB History    Gravida  4   Para  4   Term  4   Preterm      AB      Living  4     SAB      TAB      Ectopic      Multiple      Live Births  4           Family History  Problem Relation Age of Onset  . Asthma Mother   . Cancer Mother   . Depression Mother   . Migraines Mother     Social History   Tobacco Use  . Smoking status: Current Some Day Smoker    Types: Cigarettes  . Smokeless  tobacco: Never Used  . Tobacco comment: 1 pack every 3 days   Substance Use Topics  . Alcohol use: Yes  . Drug use: No    Home Medications Prior to Admission medications   Medication Sig Start Date End Date Taking? Authorizing Provider  albuterol (PROVENTIL HFA;VENTOLIN HFA) 108 (90 BASE) MCG/ACT inhaler Inhale 2 puffs into the lungs every 6 (six) hours as needed for wheezing.    [provider]  methocarbamol (ROBAXIN) 750 MG tablet Take 1 tablet (750 mg total) by mouth 3 (three) times daily as needed (muscle spasm/pain). 07/13/19   Cathren Laine, MD  metoCLOPramide (REGLAN) 5 MG tablet Take 1 tablet (5 mg total) by mouth every 6 (six) hours as needed for nausea or vomiting. 03/01/16 03/01/17  Rockne Menghini, MD  metroNIDAZOLE (FLAGYL) 500 MG tablet Take 1 tablet (500 mg total) by mouth 2 (two) times daily. 05/09/19   Levie Heritage, DO  metroNIDAZOLE (METROGEL) 0.75 % vaginal gel Place 1 Applicatorful vaginally at bedtime. Apply one applicatorful to vagina at bedtime for 10 days, then twice a week for  6 months. 05/02/19   Levie Heritage, DO  sulfamethoxazole-trimethoprim (BACTRIM DS) 800-160 MG tablet Take 1 tablet by mouth 2 (two) times daily. 05/02/19   Levie Heritage, DO    Allergies    Penicillins  Review of Systems   Review of Systems  Constitutional: Positive for activity change, appetite change and fatigue. Negative for fever.  HENT: Negative for congestion and rhinorrhea.   Eyes: Negative for visual disturbance.  Respiratory: Negative for cough, chest tightness and shortness of breath.   Cardiovascular: Negative for chest pain.  Gastrointestinal: Positive for abdominal pain, nausea and vomiting.  Genitourinary: Negative for dysuria and hematuria.  Musculoskeletal: Positive for arthralgias and myalgias.  Skin: Negative for rash.  Neurological: Negative for dizziness, weakness and headaches.   all other systems are negative except as noted in the HPI and PMH.     Physical Exam Updated Vital Signs BP (!) 156/116   Pulse 96   Temp 97.7 F (36.5 C) (Oral)   Resp 18   LMP 08/23/2019   SpO2 99%   Physical Exam Vitals and nursing note reviewed.  Constitutional:      General: She is not in acute distress.    Appearance: She is well-developed. She is obese.  HENT:     Head: Normocephalic and atraumatic.     Mouth/Throat:     Pharynx: No oropharyngeal exudate.  Eyes:     Conjunctiva/sclera: Conjunctivae normal.     Pupils: Pupils are equal, round, and reactive to light.  Neck:     Comments: No meningismus. Cardiovascular:     Rate and Rhythm: Normal rate and regular rhythm.     Heart sounds: Normal heart sounds. No murmur.  Pulmonary:     Effort: Pulmonary effort is normal. No respiratory distress.     Breath sounds: Normal breath sounds.  Abdominal:     Palpations: Abdomen is soft.     Tenderness: There is abdominal tenderness. There is no guarding or rebound.     Comments: Epigastric tenderness, no guarding or rebound No right upper quadrant tenderness  Musculoskeletal:        General: No tenderness. Normal range of motion.     Cervical back: Normal range of motion and neck supple.     Comments: No CVA tenderness  Skin:    General: Skin is warm.  Neurological:     Mental Status: She is alert and oriented to person, place, and time.     Cranial Nerves: No cranial nerve deficit.     Motor: No abnormal muscle tone.     Coordination: Coordination normal.     Comments:  5/5 strength throughout. CN 2-12 intact.Equal grip strength.   Psychiatric:        Behavior: Behavior normal.     ED Results / Procedures / Treatments   Labs (all labs ordered are listed, but only abnormal results are displayed) Labs Reviewed  RESPIRATORY PANEL BY RT PCR (FLU A&B, COVID) - Abnormal; Notable for the following components:      Result Value   SARS Coronavirus 2 by RT PCR POSITIVE (*)    All other components within normal limits  CBC WITH  DIFFERENTIAL/PLATELET - Abnormal; Notable for the following components:   RBC 5.72 (*)    Hemoglobin 11.5 (*)    MCV 65.9 (*)    MCH 20.1 (*)    RDW 19.7 (*)    All other components within normal limits  COMPREHENSIVE METABOLIC PANEL - Abnormal; Notable for the  following components:   CO2 20 (*)    Glucose, Bld 154 (*)    Calcium 8.5 (*)    All other components within normal limits  LIPASE, BLOOD  URINALYSIS, ROUTINE W REFLEX MICROSCOPIC  I-STAT BETA HCG BLOOD, ED ([facility], [facility], [facility] ONLY)    EKG None  Radiology DG Chest Portable 1 View  Result Date: 09/16/2019 CLINICAL DATA:  Emesis since this morning. Abdominal cramping. Cough. EXAM: PORTABLE CHEST 1 VIEW COMPARISON:  08/08/2018 FINDINGS: Lungs are hypoinflated without focal airspace consolidation or effusion. Cardiomediastinal silhouette and remainder of the exam is unchanged. IMPRESSION: Hypoinflation without acute cardiopulmonary disease. Electronically Signed   By: Marin Olp M.D.   On: 09/16/2019 09:34    Procedures Procedures (including critical care time)  Medications Ordered in ED Medications  sodium chloride 0.9 % bolus 1,000 mL (has no administration in time range)  ondansetron (ZOFRAN) injection 4 mg (has no administration in time range)    ED Course  I have reviewed the triage vital signs and the nursing notes.  Pertinent labs & imaging results that were available during my care of the patient were reviewed by me and considered in my medical decision making (see chart for details).    MDM Rules/Calculators/A&P                     Vomiting since this morning associated with nausea  Abdomen soft without peritoneal signs.  No hypoxia or increased work of breathing.  Labs are reassuring.  Normal LFTs and lipase.  Chest x-ray is negative. HCG negative. UA negative.  Found to be Covid positive.  She denies any difficulty breathing, chest pain or shortness of breath.  Tolerating PO without vomiting. GI cocktail  given.  No hypoxia or increased work of breathing.  Low suspicion for surgical intraabdominal process.  PO hydration, Symptomatic control.  Quarantine precautions at home.  Return precautions discussed.   NIHIRA PUELLO was evaluated in Emergency Department on 09/16/2019 for the symptoms described in the history of present illness. She was evaluated in the context of the global COVID-19 pandemic, which necessitated consideration that the patient might be at risk for infection with the SARS-CoV-2 virus that causes COVID-19. Institutional protocols and algorithms that pertain to the evaluation of patients at risk for COVID-19 are in a state of rapid change based on information released by regulatory bodies including the CDC and federal and state organizations. These policies and algorithms were followed during the patient's care in the ED.  Final Clinical Impression(s) / ED Diagnoses Final diagnoses:  Non-intractable vomiting with nausea, unspecified vomiting type  COVID-19 virus infection    Rx / DC Orders ED Discharge Orders    None       Quamere Mussell, Annie Main, MD 09/16/19 1736

## 2019-09-16 NOTE — ED Triage Notes (Signed)
Pt states that she has had emesis since this morning. Pt c/o abd cramping "all over her stomach".  Pt works at [employer].

## 2019-09-17 ENCOUNTER — Telehealth: Payer: Self-pay | Admitting: Physician Assistant

## 2019-09-17 NOTE — Telephone Encounter (Signed)
Called to discuss with Donna Sanders about Covid symptoms and the use of bamlanivimab, a monoclonal antibody infusion for those with mild to moderate Covid symptoms and at a high risk of hospitalization.     Pt is qualified for this infusion at the [facility] infusion center due to co-morbid conditions and/or a member of an at-risk group, however declines infusion at this time. Symptoms tier reviewed as well as criteria for ending isolation.  Symptoms reviewed that would warrant ED/Hospital evaluation. Preventative practices reviewed. Patient verbalized understanding. Patient advised to call back if he decides that he does want to get infusion. Callback number to the infusion center given. Patient advised to go to Urgent care or ED with severe symptoms.   Onset of symptoms: 09/15/19 BMI of 44  Donna Sanders [place], [place]

## 2020-03-05 ENCOUNTER — Ambulatory Visit (INDEPENDENT_AMBULATORY_CARE_PROVIDER_SITE_OTHER): Payer: Medicaid Other | Admitting: Obstetrics

## 2020-03-05 ENCOUNTER — Encounter: Payer: Self-pay | Admitting: Obstetrics

## 2020-03-05 ENCOUNTER — Other Ambulatory Visit (HOSPITAL_COMMUNITY)
Admission: RE | Admit: 2020-03-05 | Discharge: 2020-03-05 | Disposition: A | Discharge status: Home / Self Care | Payer: Medicaid Other | Source: Ambulatory Visit | Attending: Obstetrics | Admitting: Obstetrics

## 2020-03-05 ENCOUNTER — Other Ambulatory Visit: Payer: Self-pay

## 2020-03-05 VITALS — BP 137/87 | HR 84 | Ht 62.0 in | Wt 278.0 lb

## 2020-03-05 DIAGNOSIS — Z3202 Encounter for pregnancy test, result negative: Secondary | ICD-10-CM | POA: Diagnosis not present

## 2020-03-05 DIAGNOSIS — N898 Other specified noninflammatory disorders of vagina: Secondary | ICD-10-CM | POA: Diagnosis not present

## 2020-03-05 LAB — POCT URINE PREGNANCY: Preg Test, Ur: NEGATIVE

## 2020-03-05 NOTE — Progress Notes (Signed)
Pt is in the office complaining of vaginal discharge. Pt requests UPT, LMP 02-01-20. UPT was negative.

## 2020-03-05 NOTE — Progress Notes (Signed)
Patient ID: Donna Sanders, female   DOB: 13-Aug-1986, 34 y.o.   MRN: [id number]  Chief Complaint  Patient presents with  . GYN    HPI Donna Sanders is a 34 y.o. female.  Complains of vaginal discharge.  Denies vaginal irritation or odor. HPI  Past Medical History:  Diagnosis Date  . Asthma   . Migraine   . Obesity     Past Surgical History:  Procedure Laterality Date  . ANKLE ARTHROPLASTY      Family History  Problem Relation Age of Onset  . Asthma Mother   . Cancer Mother   . Depression Mother   . Migraines Mother     Social History Social History   Tobacco Use  . Smoking status: Current Some Day Smoker    Types: Cigarettes  . Smokeless tobacco: Never Used  . Tobacco comment: 1 pack every 3 days   Vaping Use  . Vaping Use: Never used  Substance Use Topics  . Alcohol use: Yes  . Drug use: No    Allergies  Allergen Reactions  . Penicillins Hives, Shortness Of Breath, Itching and Swelling    Throat swelling    Current Outpatient Medications  Medication Sig Dispense Refill  . albuterol (PROVENTIL HFA;VENTOLIN HFA) 108 (90 BASE) MCG/ACT inhaler Inhale 2 puffs into the lungs every 6 (six) hours as needed for wheezing. (Patient not taking: Reported on 03/05/2020)    . methocarbamol (ROBAXIN) 750 MG tablet Take 1 tablet (750 mg total) by mouth 3 (three) times daily as needed (muscle spasm/pain). (Patient not taking: Reported on 09/16/2019) 15 tablet 0  . metoCLOPramide (REGLAN) 5 MG tablet Take 1 tablet (5 mg total) by mouth every 6 (six) hours as needed for nausea or vomiting. (Patient not taking: Reported on 09/16/2019) 12 tablet 0  . metroNIDAZOLE (FLAGYL) 500 MG tablet Take 1 tablet (500 mg total) by mouth 2 (two) times daily. (Patient not taking: Reported on 09/16/2019) 14 tablet 0  . metroNIDAZOLE (METROGEL) 0.75 % vaginal gel Place 1 Applicatorful vaginally at bedtime. Apply one applicatorful to vagina at bedtime for 10 days, then twice a week for 6 months.  (Patient not taking: Reported on 09/16/2019) 70 g 5  . ondansetron (ZOFRAN ODT) 4 MG disintegrating tablet Take 1 tablet (4 mg total) by mouth every 8 (eight) hours as needed for nausea or vomiting. (Patient not taking: Reported on 03/05/2020) 20 tablet 0  . sulfamethoxazole-trimethoprim (BACTRIM DS) 800-160 MG tablet Take 1 tablet by mouth 2 (two) times daily. (Patient not taking: Reported on 09/16/2019) 14 tablet 1   No current facility-administered medications for this visit.    Review of Systems Review of Systems Constitutional: negative for fatigue and weight loss Respiratory: negative for cough and wheezing Cardiovascular: negative for chest pain, fatigue and palpitations Gastrointestinal: negative for abdominal pain and change in bowel habits Genitourinary:positive for vaginal discharge Integument/breast: negative for nipple discharge Musculoskeletal:negative for myalgias Neurological: negative for gait problems and tremors Behavioral/Psych: negative for abusive relationship, depression Endocrine: negative for temperature intolerance      Blood pressure 137/87, pulse 84, height 5\' 2"  (1.575 m), weight 278 lb (126.1 kg), unknown if currently breastfeeding.  Physical Exam Physical Exam           General: Alert and no distress Abdomen:  normal findings: no organomegaly, soft, non-tender and no hernia  Pelvis:  External genitalia: normal general appearance Urinary system: urethral meatus normal and bladder without fullness, nontender Vaginal: normal without tenderness, induration or  masses Cervix: normal appearance Adnexa: normal bimanual exam Uterus: anteverted and non-tender, normal size    50% of 15 min visit spent on counseling and coordination of care.   Data Reviewed Wet Prep  Assessment     1. Vaginal discharge Rx: - Cervicovaginal ancillary only( [facility])  2. Pregnancy test negative Rx: - POCT urine pregnancy    Plan  Follow up in 3 months for Annual /  Pap   Orders Placed This Encounter  Procedures  . POCT urine pregnancy     Brock Bad, MD 03/05/2020 4:54 PM

## 2020-03-09 ENCOUNTER — Other Ambulatory Visit: Payer: Self-pay | Admitting: Obstetrics

## 2020-03-09 DIAGNOSIS — B3731 Acute candidiasis of vulva and vagina: Secondary | ICD-10-CM

## 2020-03-09 LAB — CERVICOVAGINAL ANCILLARY ONLY
Bacterial Vaginitis (gardnerella): NEGATIVE
Candida Glabrata: NEGATIVE
Candida Vaginitis: POSITIVE — AB
Chlamydia: NEGATIVE
Comment: NEGATIVE
Comment: NEGATIVE
Comment: NEGATIVE
Comment: NEGATIVE
Comment: NEGATIVE
Comment: NORMAL
Neisseria Gonorrhea: NEGATIVE
Trichomonas: NEGATIVE

## 2020-03-09 MED ORDER — FLUCONAZOLE 150 MG PO TABS: 150.0000 mg | ORAL_TABLET | Freq: Once | ORAL | 0 refills | Status: AC

## 2020-03-20 ENCOUNTER — Ambulatory Visit (HOSPITAL_COMMUNITY)
Admission: EM | Admit: 2020-03-20 | Discharge: 2020-03-20 | Disposition: A | Discharge status: Home / Self Care | Payer: Medicaid Other | Attending: Physician Assistant | Admitting: Physician Assistant

## 2020-03-20 ENCOUNTER — Encounter (HOSPITAL_COMMUNITY): Payer: Self-pay

## 2020-03-20 ENCOUNTER — Other Ambulatory Visit: Payer: Self-pay

## 2020-03-20 DIAGNOSIS — R0789 Other chest pain: Secondary | ICD-10-CM

## 2020-03-20 MED ORDER — LIDOCAINE 4 % EX PTCH: 1.0000 | MEDICATED_PATCH | Freq: Two times a day (BID) | CUTANEOUS | 0 refills | Status: DC

## 2020-03-20 MED ORDER — NAPROXEN 500 MG PO TABS: 500.0000 mg | ORAL_TABLET | Freq: Two times a day (BID) | ORAL | 0 refills | Status: DC

## 2020-03-20 NOTE — ED Provider Notes (Signed)
[facility]    CSN: [id number] Arrival date & time: 03/20/20  1918      History   Chief Complaint Chief Complaint  Patient presents with  . right chest soreness    HPI Donna Sanders is a 34 y.o. female.   Patient reports for right upper chest pain.  This been present for 2 days.  She reports is only present if is being pressed on, however she does endorse some intermittent sharp pains here.  Denies pain with deep breath.  Denies radiation of the left side.  Denies shortness of breath.  She denies injury.  Denies recent coughing.  She denies any fevers or chills.  Denies any rashes.  She does not get nauseous with the pain does not get sweaty.  She has not taken any medicines for it.       Past Medical History:  Diagnosis Date  . Asthma   . Migraine   . Obesity     Patient Active Problem List   Diagnosis Date Noted  . Abscess 04/09/2013  . Backache 04/09/2013  . Excessive fetal growth affecting management of mother, antepartum 03/26/2013  . Allergic rhinitis, seasonal 12/20/2012    Past Surgical History:  Procedure Laterality Date  . ANKLE ARTHROPLASTY      OB History    Gravida  4   Para  4   Term  4   Preterm      AB      Living  4     SAB      TAB      Ectopic      Multiple      Live Births  4            Home Medications    Prior to Admission medications   Medication Sig Start Date End Date Taking? Authorizing Provider  albuterol (PROVENTIL HFA;VENTOLIN HFA) 108 (90 BASE) MCG/ACT inhaler Inhale 2 puffs into the lungs every 6 (six) hours as needed for wheezing. Patient not taking: Reported on 03/05/2020    [provider]  Lidocaine (HM LIDOCAINE PATCH) 4 % PTCH Apply 1 patch topically in the morning and at bedtime. 03/20/20   Synia Douglass, Veryl Speak, PA-C  methocarbamol (ROBAXIN) 750 MG tablet Take 1 tablet (750 mg total) by mouth 3 (three) times daily as needed (muscle spasm/pain). Patient not taking: Reported on  09/16/2019 07/13/19   Cathren Laine, MD  metoCLOPramide (REGLAN) 5 MG tablet Take 1 tablet (5 mg total) by mouth every 6 (six) hours as needed for nausea or vomiting. Patient not taking: Reported on 09/16/2019 03/01/16 03/01/17  Rockne Menghini, MD  metroNIDAZOLE (FLAGYL) 500 MG tablet Take 1 tablet (500 mg total) by mouth 2 (two) times daily. Patient not taking: Reported on 09/16/2019 05/09/19   Levie Heritage, DO  metroNIDAZOLE (METROGEL) 0.75 % vaginal gel Place 1 Applicatorful vaginally at bedtime. Apply one applicatorful to vagina at bedtime for 10 days, then twice a week for 6 months. Patient not taking: Reported on 09/16/2019 05/02/19   Levie Heritage, DO  naproxen (NAPROSYN) 500 MG tablet Take 1 tablet (500 mg total) by mouth 2 (two) times daily. 03/20/20   Ronnett Pullin, Veryl Speak, PA-C  ondansetron (ZOFRAN ODT) 4 MG disintegrating tablet Take 1 tablet (4 mg total) by mouth every 8 (eight) hours as needed for nausea or vomiting. Patient not taking: Reported on 03/05/2020 09/16/19   Glynn Octave, MD  sulfamethoxazole-trimethoprim (BACTRIM DS) 800-160 MG tablet Take 1 tablet  by mouth 2 (two) times daily. Patient not taking: Reported on 09/16/2019 05/02/19   Levie Heritage, DO    Family History Family History  Problem Relation Age of Onset  . Asthma Mother   . Cancer Mother   . Depression Mother   . Migraines Mother     Social History Social History   Tobacco Use  . Smoking status: Current Some Day Smoker    Types: Cigarettes  . Smokeless tobacco: Never Used  . Tobacco comment: 1 pack every 3 days   Vaping Use  . Vaping Use: Never used  Substance Use Topics  . Alcohol use: Yes  . Drug use: No     Allergies   Penicillins   Review of Systems Review of Systems   Physical Exam Triage Vital Signs ED Triage Vitals  Enc Vitals Group     BP 03/20/20 2016 (!) 153/97     Pulse Rate 03/20/20 2016 77     Resp 03/20/20 2016 18     Temp 03/20/20 2016 97.7 F (36.5 C)     Temp  Source 03/20/20 2016 Oral     SpO2 03/20/20 2016 97 %     Weight --      Height --      Head Circumference --      Peak Flow --      Pain Score 03/20/20 2014 10     Pain Loc --      Pain Edu? --      Excl. in GC? --    No data found.  Updated Vital Signs BP (!) 153/97 (BP Location: Right Wrist)   Pulse 77   Temp 97.7 F (36.5 C) (Oral)   Resp 18   LMP 03/07/2020   SpO2 97%   Visual Acuity Right Eye Distance:   Left Eye Distance:   Bilateral Distance:    Right Eye Near:   Left Eye Near:    Bilateral Near:     Physical Exam Vitals and nursing note reviewed.  Constitutional:      General: She is not in acute distress.    Appearance: She is well-developed. She is not ill-appearing.  HENT:     Head: Normocephalic and atraumatic.  Eyes:     Conjunctiva/sclera: Conjunctivae normal.  Cardiovascular:     Rate and Rhythm: Normal rate and regular rhythm.     Heart sounds: No murmur heard.   Pulmonary:     Effort: Pulmonary effort is normal. No respiratory distress.     Breath sounds: Normal breath sounds. No wheezing or rales.     Comments: Breath sounds equal bilaterally.  Bilateral rise and fall the chest Abdominal:     Palpations: Abdomen is soft.     Tenderness: There is no abdominal tenderness.  Musculoskeletal:     Cervical back: Neck supple.     Comments: Tenderness to palpation of the musculature of the right upper chest, into the sternal border on the right side.  No left-sided chest pain or tenderness.  No pain elicited with movement of the upper extremities.  There is pain elicited with movement of the torso however.  Skin:    General: Skin is warm and dry.  Neurological:     Mental Status: She is alert.      UC Treatments / Results  Labs (all labs ordered are listed, but only abnormal results are displayed) Labs Reviewed - No data to display  EKG   Radiology No results found.  Procedures Procedures (including critical care  time)  Medications Ordered in UC Medications - No data to display  Initial Impression / Assessment and Plan / UC Course  I have reviewed the triage vital signs and the nursing notes.  Pertinent labs & imaging results that were available during my care of the patient were reviewed by me and considered in my medical decision making (see chart for details).     #Chest wall pain Patient is a 34 year old otherwise healthy female presenting with chest wall pain likely related to costochondritis or muscle strain.  Given it has a highly reproducible nature, doubt intrathoracic pathology or cardiac.  Will treat with NSAIDs and topical lidocaine patch.  Heat and ice therapy as preferred.  Discussed follow-up and emergency department precautions.  Patient verbalized understanding plan of care Final Clinical Impressions(s) / UC Diagnoses   Final diagnoses:  Chest wall pain     Discharge Instructions     Take the naproxen, twice a day with food Use patches as needed  Apply heat or ice as needed  If severely worsening, with shortness of breath, go to the Emergency Department      ED Prescriptions    Medication Sig Dispense Auth. Provider   naproxen (NAPROSYN) 500 MG tablet Take 1 tablet (500 mg total) by mouth 2 (two) times daily. 30 tablet Blayden Conwell, Veryl Speak, PA-C   Lidocaine (HM LIDOCAINE PATCH) 4 % PTCH Apply 1 patch topically in the morning and at bedtime. 5 patch Randeep Biondolillo, Veryl Speak, PA-C     PDMP not reviewed this encounter.   Hermelinda Medicus, PA-C 03/21/20 [id number]

## 2020-03-20 NOTE — ED Triage Notes (Signed)
Pt c/o right sided chest soreness/heaviness for past two days that increases with bending over/palpation. Pt points to area just above right breast and on upper right breast.  Denies back/jaw/arm pain, n/v, SOB, dizziness, diaphoresis or trauma to area.  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

## 2020-03-20 NOTE — Discharge Instructions (Addendum)
Take the naproxen, twice a day with food Use patches as needed  Apply heat or ice as needed  If severely worsening, with shortness of breath, go to the Emergency Department

## 2020-07-17 ENCOUNTER — Ambulatory Visit (HOSPITAL_COMMUNITY)
Admission: EM | Admit: 2020-07-17 | Discharge: 2020-07-17 | Disposition: A | Discharge status: Home / Self Care | Payer: Medicaid Other | Attending: Physician Assistant | Admitting: Physician Assistant

## 2020-07-17 ENCOUNTER — Other Ambulatory Visit: Payer: Self-pay

## 2020-07-17 ENCOUNTER — Encounter (HOSPITAL_COMMUNITY): Payer: Self-pay

## 2020-07-17 DIAGNOSIS — Z3201 Encounter for pregnancy test, result positive: Secondary | ICD-10-CM | POA: Diagnosis not present

## 2020-07-17 DIAGNOSIS — R112 Nausea with vomiting, unspecified: Secondary | ICD-10-CM | POA: Diagnosis not present

## 2020-07-17 LAB — POC URINE PREG, ED: Preg Test, Ur: POSITIVE — AB

## 2020-07-17 MED ORDER — ONDANSETRON 4 MG PO TBDP
ORAL_TABLET | ORAL | Status: AC
  Filled 2020-07-17: qty 1

## 2020-07-17 MED ORDER — ONDANSETRON 4 MG PO TBDP
4.0000 mg | ORAL_TABLET | Freq: Once | ORAL | Status: AC
  Administered 2020-07-17: 4 mg via ORAL

## 2020-07-17 NOTE — ED Provider Notes (Addendum)
[facility]    CSN: [id number] Arrival date & time: 07/17/20  1115      History   Chief Complaint Chief Complaint  Patient presents with  . Emesis    since last night  . Nausea    since last night    HPI Donna Sanders is a 34 y.o. female.   Pt complains of nausea and vomiting that started yesterday.  Reports she experienced a headache yesterday which resolved with excedrin, after resolution of HA she began to experience nausea and vomiting.  She is keeping fluids down. Denies abdominal pain, fever, chills, dysuria, flank pain.      Past Medical History:  Diagnosis Date  . Asthma   . Migraine   . Obesity     Patient Active Problem List   Diagnosis Date Noted  . Abscess 04/09/2013  . Backache 04/09/2013  . Excessive fetal growth affecting management of mother, antepartum 03/26/2013  . Allergic rhinitis, seasonal 12/20/2012    Past Surgical History:  Procedure Laterality Date  . ANKLE ARTHROPLASTY      OB History    Gravida  4   Para  4   Term  4   Preterm      AB      Living  4     SAB      TAB      Ectopic      Multiple      Live Births  4            Home Medications    Prior to Admission medications   Medication Sig Start Date End Date Taking? Authorizing Provider  albuterol (PROVENTIL HFA;VENTOLIN HFA) 108 (90 BASE) MCG/ACT inhaler Inhale 2 puffs into the lungs every 6 (six) hours as needed for wheezing.    Yes [provider]  Lidocaine (HM LIDOCAINE PATCH) 4 % PTCH Apply 1 patch topically in the morning and at bedtime. 03/20/20   Darr, Gerilyn Pilgrim, PA-C  methocarbamol (ROBAXIN) 750 MG tablet Take 1 tablet (750 mg total) by mouth 3 (three) times daily as needed (muscle spasm/pain). Patient not taking: Reported on 09/16/2019 07/13/19   Cathren Laine, MD  metoCLOPramide (REGLAN) 5 MG tablet Take 1 tablet (5 mg total) by mouth every 6 (six) hours as needed for nausea or vomiting. Patient not taking: Reported on  09/16/2019 03/01/16 03/01/17  Rockne Menghini, MD  metroNIDAZOLE (FLAGYL) 500 MG tablet Take 1 tablet (500 mg total) by mouth 2 (two) times daily. Patient not taking: Reported on 09/16/2019 05/09/19   Levie Heritage, DO  metroNIDAZOLE (METROGEL) 0.75 % vaginal gel Place 1 Applicatorful vaginally at bedtime. Apply one applicatorful to vagina at bedtime for 10 days, then twice a week for 6 months. Patient not taking: Reported on 09/16/2019 05/02/19   Levie Heritage, DO  naproxen (NAPROSYN) 500 MG tablet Take 1 tablet (500 mg total) by mouth 2 (two) times daily. 03/20/20   Darr, Gerilyn Pilgrim, PA-C  ondansetron (ZOFRAN ODT) 4 MG disintegrating tablet Take 1 tablet (4 mg total) by mouth every 8 (eight) hours as needed for nausea or vomiting. Patient not taking: Reported on 03/05/2020 09/16/19   Glynn Octave, MD  sulfamethoxazole-trimethoprim (BACTRIM DS) 800-160 MG tablet Take 1 tablet by mouth 2 (two) times daily. Patient not taking: Reported on 09/16/2019 05/02/19   Levie Heritage, DO    Family History Family History  Problem Relation Age of Onset  . Asthma Mother   . Cancer Mother   .  Depression Mother   . Migraines Mother     Social History Social History   Tobacco Use  . Smoking status: Current Some Day Smoker    Types: Cigarettes  . Smokeless tobacco: Never Used  . Tobacco comment: 1 pack every 3 days   Vaping Use  . Vaping Use: Never used  Substance Use Topics  . Alcohol use: Yes  . Drug use: No     Allergies   Penicillins   Review of Systems Review of Systems  Constitutional: Negative for chills and fever.  HENT: Negative for ear pain and sore throat.   Eyes: Negative for pain and visual disturbance.  Respiratory: Negative for cough and shortness of breath.   Cardiovascular: Negative for chest pain and palpitations.  Gastrointestinal: Positive for nausea and vomiting. Negative for abdominal pain.  Genitourinary: Negative for dysuria and hematuria.  Musculoskeletal:  Negative for arthralgias and back pain.  Skin: Negative for color change and rash.  Neurological: Negative for seizures and syncope.  All other systems reviewed and are negative.    Physical Exam Triage Vital Signs ED Triage Vitals  Enc Vitals Group     BP 07/17/20 1306 129/85     Pulse Rate 07/17/20 1306 87     Resp 07/17/20 1306 18     Temp 07/17/20 1306 98.2 F (36.8 C)     Temp Source 07/17/20 1306 Oral     SpO2 07/17/20 1306 100 %     Weight --      Height --      Head Circumference --      Peak Flow --      Pain Score 07/17/20 1309 7     Pain Loc --      Pain Edu? --      Excl. in GC? --    No data found.  Updated Vital Signs BP 129/85 (BP Location: Right Arm)   Pulse 87   Temp 98.2 F (36.8 C) (Oral)   Resp 18   LMP 05/26/2020 (Approximate)   SpO2 100%   Visual Acuity Right Eye Distance:   Left Eye Distance:   Bilateral Distance:    Right Eye Near:   Left Eye Near:    Bilateral Near:     Physical Exam Vitals and nursing note reviewed.  Constitutional:      General: She is not in acute distress.    Appearance: She is well-developed.  HENT:     Head: Normocephalic and atraumatic.  Eyes:     Conjunctiva/sclera: Conjunctivae normal.  Cardiovascular:     Rate and Rhythm: Normal rate and regular rhythm.     Heart sounds: No murmur heard.   Pulmonary:     Effort: Pulmonary effort is normal. No respiratory distress.     Breath sounds: Normal breath sounds.  Abdominal:     General: Bowel sounds are normal.     Palpations: Abdomen is soft.     Tenderness: There is no abdominal tenderness. There is no right CVA tenderness or left CVA tenderness.  Musculoskeletal:     Cervical back: Neck supple.  Skin:    General: Skin is warm and dry.  Neurological:     Mental Status: She is alert.      UC Treatments / Results  Labs (all labs ordered are listed, but only abnormal results are displayed) Labs Reviewed - No data to  display  EKG   Radiology No results found.  Procedures Procedures (including critical care time)  Medications Ordered in UC Medications - No data to display  Initial Impression / Assessment and Plan / UC Course  I have reviewed the triage vital signs and the nursing notes.  Pertinent labs & imaging results that were available during my care of the patient were reviewed by me and considered in my medical decision making (see chart for details).     Well appearing, vitals WNL, benign abdominal exam.  She will continue to stay hydrated. Positive pregnancy test. Follow up with OBGYN. Return precautions discussed.  Final Clinical Impressions(s) / UC Diagnoses   Final diagnoses:  None   Discharge Instructions   None    ED Prescriptions    None     PDMP not reviewed this encounter.   Jodell Cipro, PA-C 07/17/20 1403    Jodell Cipro, PA-C 07/17/20 1423

## 2020-07-17 NOTE — ED Triage Notes (Signed)
Patient states she had a migraine type headache yesterday early in the day that resolved. Pt then became nauseated and has been having emesis episodes since yesterday. Pt is aox4 and ambulatory.

## 2020-07-17 NOTE — Discharge Instructions (Addendum)
Follow up with OBGYN on Monday

## 2020-07-29 ENCOUNTER — Encounter (HOSPITAL_COMMUNITY): Payer: Self-pay | Admitting: Obstetrics and Gynecology

## 2020-07-29 ENCOUNTER — Inpatient Hospital Stay (HOSPITAL_COMMUNITY): Payer: 59

## 2020-07-29 ENCOUNTER — Inpatient Hospital Stay (HOSPITAL_COMMUNITY)
Admission: AD | Admit: 2020-07-29 | Discharge: 2020-07-29 | Disposition: A | Discharge status: Home / Self Care | Payer: 59 | Attending: Obstetrics and Gynecology | Admitting: Obstetrics and Gynecology

## 2020-07-29 ENCOUNTER — Other Ambulatory Visit: Payer: Self-pay

## 2020-07-29 DIAGNOSIS — Z3A01 Less than 8 weeks gestation of pregnancy: Secondary | ICD-10-CM | POA: Diagnosis not present

## 2020-07-29 DIAGNOSIS — O209 Hemorrhage in early pregnancy, unspecified: Secondary | ICD-10-CM | POA: Insufficient documentation

## 2020-07-29 DIAGNOSIS — O468X1 Other antepartum hemorrhage, first trimester: Secondary | ICD-10-CM

## 2020-07-29 DIAGNOSIS — O99331 Smoking (tobacco) complicating pregnancy, first trimester: Secondary | ICD-10-CM | POA: Insufficient documentation

## 2020-07-29 DIAGNOSIS — O09521 Supervision of elderly multigravida, first trimester: Secondary | ICD-10-CM | POA: Insufficient documentation

## 2020-07-29 DIAGNOSIS — O418X10 Other specified disorders of amniotic fluid and membranes, first trimester, not applicable or unspecified: Secondary | ICD-10-CM

## 2020-07-29 DIAGNOSIS — F17210 Nicotine dependence, cigarettes, uncomplicated: Secondary | ICD-10-CM | POA: Insufficient documentation

## 2020-07-29 DIAGNOSIS — Z3A09 9 weeks gestation of pregnancy: Secondary | ICD-10-CM | POA: Diagnosis not present

## 2020-07-29 DIAGNOSIS — Z3490 Encounter for supervision of normal pregnancy, unspecified, unspecified trimester: Secondary | ICD-10-CM

## 2020-07-29 LAB — CBC
HCT: 33.7 % — ABNORMAL LOW (ref 36.0–46.0)
Hemoglobin: 10.5 g/dL — ABNORMAL LOW (ref 12.0–15.0)
MCH: 20.3 pg — ABNORMAL LOW (ref 26.0–34.0)
MCHC: 31.2 g/dL (ref 30.0–36.0)
MCV: 65.1 fL — ABNORMAL LOW (ref 80.0–100.0)
Platelets: 299 10*3/uL (ref 150–400)
RBC: 5.18 MIL/uL — ABNORMAL HIGH (ref 3.87–5.11)
RDW: 17.2 % — ABNORMAL HIGH (ref 11.5–15.5)
WBC: 10.6 10*3/uL — ABNORMAL HIGH (ref 4.0–10.5)
nRBC: 0 % (ref 0.0–0.2)

## 2020-07-29 LAB — URINALYSIS, MICROSCOPIC (REFLEX): Bacteria, UA: NONE SEEN

## 2020-07-29 LAB — URINALYSIS, ROUTINE W REFLEX MICROSCOPIC
Bilirubin Urine: NEGATIVE
Glucose, UA: NEGATIVE mg/dL
Ketones, ur: NEGATIVE mg/dL
Leukocytes,Ua: NEGATIVE
Nitrite: NEGATIVE
Protein, ur: NEGATIVE mg/dL
Specific Gravity, Urine: 1.025 (ref 1.005–1.030)
pH: 5.5 (ref 5.0–8.0)

## 2020-07-29 LAB — WET PREP, GENITAL
Clue Cells Wet Prep HPF POC: NONE SEEN
Sperm: NONE SEEN
Trich, Wet Prep: NONE SEEN
WBC, Wet Prep HPF POC: NONE SEEN
Yeast Wet Prep HPF POC: NONE SEEN

## 2020-07-29 LAB — HCG, QUANTITATIVE, PREGNANCY: hCG, Beta Chain, Quant, S: 4338 m[IU]/mL — ABNORMAL HIGH (ref <5)

## 2020-07-29 NOTE — MAU Note (Signed)
Patient went to have a TAB this morning.  Reports she has been having bleeding for the past 3 weeks.  The clinic told her she appeared to be having a miscarriage and that she needed to be seen here immediately.  Patient denies seeing anything that looks like tissue.  Denies any pain. LMP 10/5

## 2020-07-29 NOTE — Discharge Instructions (Signed)

## 2020-07-29 NOTE — MAU Provider Note (Signed)
History     CSN: [id number]  Arrival date and time: 07/29/20 1556   First Provider Initiated Contact with Patient 07/29/20 1646      Chief Complaint  Patient presents with  . Vaginal Bleeding   Donna Sanders is a 34 y.o. [id number] at [redacted]w[redacted]d based on definite LMP of 05/26/2020.  She presents today for Vaginal Bleeding.  She states the bleeding started 3-4 weeks ago.  She reports she was going to get an abortion and was told that they were unsure of pregnancy location and that her cervix was open.  Patient reports heavy bleeding and changing her pad 2x/day.  Patient endorses passing of clots that "are small and big."  She denies cramping.  No recent sexual activity.    OB History    Gravida  5   Para  4   Term  4   Preterm      AB      Living  4     SAB      TAB      Ectopic      Multiple      Live Births  4           Past Medical History:  Diagnosis Date  . Asthma   . Migraine   . Obesity     Past Surgical History:  Procedure Laterality Date  . ANKLE ARTHROPLASTY      Family History  Problem Relation Age of Onset  . Asthma Mother   . Cancer Mother   . Depression Mother   . Migraines Mother     Social History   Tobacco Use  . Smoking status: Current Some Day Smoker    Packs/day: 0.25    Types: Cigarettes  . Smokeless tobacco: Never Used  . Tobacco comment: 1 pack every 3 days   Vaping Use  . Vaping Use: Never used  Substance Use Topics  . Alcohol use: Not Currently  . Drug use: No    Allergies:  Allergies  Allergen Reactions  . Penicillins Hives, Shortness Of Breath, Itching and Swelling    Throat swelling    Medications Prior to Admission  Medication Sig Dispense Refill Last Dose  . albuterol (PROVENTIL HFA;VENTOLIN HFA) 108 (90 BASE) MCG/ACT inhaler Inhale 2 puffs into the lungs every 6 (six) hours as needed for wheezing.    Past Month at Unknown time  . Lidocaine (HM LIDOCAINE PATCH) 4 % PTCH Apply 1 patch topically in the  morning and at bedtime. 5 patch 0   . methocarbamol (ROBAXIN) 750 MG tablet Take 1 tablet (750 mg total) by mouth 3 (three) times daily as needed (muscle spasm/pain). (Patient not taking: Reported on 09/16/2019) 15 tablet 0   . metoCLOPramide (REGLAN) 5 MG tablet Take 1 tablet (5 mg total) by mouth every 6 (six) hours as needed for nausea or vomiting. (Patient not taking: Reported on 09/16/2019) 12 tablet 0   . metroNIDAZOLE (FLAGYL) 500 MG tablet Take 1 tablet (500 mg total) by mouth 2 (two) times daily. (Patient not taking: Reported on 09/16/2019) 14 tablet 0   . metroNIDAZOLE (METROGEL) 0.75 % vaginal gel Place 1 Applicatorful vaginally at bedtime. Apply one applicatorful to vagina at bedtime for 10 days, then twice a week for 6 months. (Patient not taking: Reported on 09/16/2019) 70 g 5   . naproxen (NAPROSYN) 500 MG tablet Take 1 tablet (500 mg total) by mouth 2 (two) times daily. 30 tablet 0   . ondansetron (  ZOFRAN ODT) 4 MG disintegrating tablet Take 1 tablet (4 mg total) by mouth every 8 (eight) hours as needed for nausea or vomiting. (Patient not taking: Reported on 03/05/2020) 20 tablet 0   . sulfamethoxazole-trimethoprim (BACTRIM DS) 800-160 MG tablet Take 1 tablet by mouth 2 (two) times daily. (Patient not taking: Reported on 09/16/2019) 14 tablet 1     Review of Systems  Constitutional: Negative for chills and fever.  Eyes: Negative for visual disturbance.  Respiratory: Negative for cough and shortness of breath.   Gastrointestinal: Negative for abdominal pain, nausea and vomiting.  Genitourinary: Positive for vaginal bleeding. Negative for difficulty urinating, dysuria and vaginal discharge.  Neurological: Negative for dizziness, light-headedness and headaches.   Physical Exam   Blood pressure (!) 141/52, pulse 81, temperature 98.3 F (36.8 C), resp. rate 17, weight 126.8 kg, last menstrual period 05/26/2020, unknown if currently breastfeeding.  Physical Exam Vitals and nursing note  reviewed. Exam conducted with a chaperone present.  Constitutional:      General: She is not in acute distress.    Appearance: Normal appearance. She is obese. She is not ill-appearing.  HENT:     Head: Normocephalic and atraumatic.  Eyes:     Conjunctiva/sclera: Conjunctivae normal.  Cardiovascular:     Rate and Rhythm: Normal rate and regular rhythm.  Pulmonary:     Effort: Pulmonary effort is normal. No respiratory distress.  Abdominal:     Tenderness: There is no abdominal tenderness.  Genitourinary:    Comments: Speculum Exam: -Normal External Genitalia: Non tender, blood noted at introitus.  -Vaginal Vault: Pink mucosa with good rugae. Small amt blood noted-wet prep collected -Cervix:Pink, no lesions, cysts, or polyps.  Appears closed. No active bleeding from os-GC/CT collected -Bimanual Exam:  Deferred  Musculoskeletal:        General: Normal range of motion.     Cervical back: Normal range of motion.  Skin:    General: Skin is warm and dry.  Neurological:     Mental Status: She is lethargic.  Psychiatric:        Mood and Affect: Mood normal.        Thought Content: Thought content normal.     MAU Course  Procedures Results for orders placed or performed during the hospital encounter of 07/29/20 (from the past 24 hour(s))  Urinalysis, Routine w reflex microscopic Urine, Clean Catch     Status: Abnormal   Collection Time: 07/29/20  4:33 PM  Result Value Ref Range   Color, Urine YELLOW YELLOW   APPearance CLEAR CLEAR   Specific Gravity, Urine 1.025 1.005 - 1.030   pH 5.5 5.0 - 8.0   Glucose, UA NEGATIVE NEGATIVE mg/dL   Hgb urine dipstick MODERATE (A) NEGATIVE   Bilirubin Urine NEGATIVE NEGATIVE   Ketones, ur NEGATIVE NEGATIVE mg/dL   Protein, ur NEGATIVE NEGATIVE mg/dL   Nitrite NEGATIVE NEGATIVE   Leukocytes,Ua NEGATIVE NEGATIVE  Urinalysis, Microscopic (reflex)     Status: None   Collection Time: 07/29/20  4:33 PM  Result Value Ref Range   RBC / HPF 0-5 0  - 5 RBC/hpf   WBC, UA 0-5 0 - 5 WBC/hpf   Bacteria, UA NONE SEEN NONE SEEN   Squamous Epithelial / LPF 0-5 0 - 5  CBC     Status: Abnormal   Collection Time: 07/29/20  5:09 PM  Result Value Ref Range   WBC 10.6 (H) 4.0 - 10.5 K/uL   RBC 5.18 (H) 3.87 - 5.11 MIL/uL  Hemoglobin 10.5 (L) 12.0 - 15.0 g/dL   HCT 92.1 (L) 36 - 46 %   MCV 65.1 (L) 80.0 - 100.0 fL   MCH 20.3 (L) 26.0 - 34.0 pg   MCHC 31.2 30.0 - 36.0 g/dL   RDW 19.4 (H) 17.4 - 08.1 %   Platelets 299 150 - 400 K/uL   nRBC 0.0 0.0 - 0.2 %  hCG, quantitative, pregnancy     Status: Abnormal   Collection Time: 07/29/20  5:09 PM  Result Value Ref Range   hCG, Beta Chain, Quant, S 4,338 (H) <5 mIU/mL  Wet prep, genital     Status: None   Collection Time: 07/29/20  5:27 PM   Specimen: PATH Cytology Cervicovaginal Ancillary Only  Result Value Ref Range   Yeast Wet Prep HPF POC NONE SEEN NONE SEEN   Trich, Wet Prep NONE SEEN NONE SEEN   Clue Cells Wet Prep HPF POC NONE SEEN NONE SEEN   WBC, Wet Prep HPF POC NONE SEEN NONE SEEN   Sperm NONE SEEN    US OB LESS THAN 14 WEEKS WITH OB TRANSVAGINAL  Result Date: 07/29/2020 CLINICAL DATA:  Initial evaluation for acute vaginal bleeding, early pregnancy. EXAM: OBSTETRIC <14 WK [place] AND TRANSVAGINAL OB US TECHNIQUE: Both transabdominal and transvaginal ultrasound examinations were performed for complete evaluation of the gestation as well as the maternal uterus, adnexal regions, and pelvic cul-de-sac. Transvaginal technique was performed to assess early pregnancy. COMPARISON:  None. FINDINGS: Intrauterine gestational sac: Single Yolk sac:  None visible Embryo:  Present Cardiac Activity: Present Heart Rate: 159 bpm CRL: 7.8 mm   6 w   4 d                  [place] EDC: 03/20/2021 Subchorionic hemorrhage: Moderate subchorionic hemorrhage measuring 2.6 x 2.2 x 2.2 cm without associated mass effect. Maternal uterus/adnexae: Ovaries normal in appearance bilaterally. No adnexal mass or free fluid.  IMPRESSION: 1. Single viable intrauterine pregnancy as above, estimated gestational age [redacted] weeks and 4 days by crown-rump length, with ultrasound EDC of 02/21/2021. 2. 2.6 x 2.2 x 2.2 cm subchorionic hemorrhage. 3. No other acute maternal uterine or adnexal abnormality. Electronically Signed   By: Rise Mu M.D.   On: 07/29/2020 18:35    MDM Pelvic Exam; Wet Prep and GC/CT Labs: UA, UPT, CBC, hCG, ABO Ultrasound Assessment and Plan  34 year old, [id number]  SIUP at 9.1weeks Vaginal Bleeding  -Reviewed POC with patient. -Exam performed and findings discussed.  -Cultures collected and pending.  -Will send for [place] and await results. -Informed of potential delay with hCG results due to down time in lab.  Cherre Robins 07/29/2020, 4:46 PM   Reassessment (7:17 PM) SIUP at 6.4 weeks [facility]  -Results as above. -Provider to bedside to inform patient of all findings. -Educated on [facility], what to expect including bleeding, risks for miscarriage, and resolution.  -Patient states she still plans to terminate. -Precautions given. -Encouraged to call or return to MAU if symptoms worsen or with the onset of new symptoms. -Discharged to home in stable condition.  Cherre Robins MSN, CNM Advanced Practice Provider, [facility] [employer]

## 2020-07-30 LAB — GC/CHLAMYDIA PROBE AMP (~~LOC~~) NOT AT ARMC
Chlamydia: NEGATIVE
Comment: NEGATIVE
Comment: NORMAL
Neisseria Gonorrhea: NEGATIVE

## 2020-08-06 ENCOUNTER — Encounter (HOSPITAL_COMMUNITY): Payer: Self-pay | Admitting: Obstetrics & Gynecology

## 2020-08-06 ENCOUNTER — Other Ambulatory Visit: Payer: Self-pay

## 2020-08-06 ENCOUNTER — Inpatient Hospital Stay (HOSPITAL_COMMUNITY)
Admission: AD | Admit: 2020-08-06 | Discharge: 2020-08-07 | Disposition: A | Discharge status: Home / Self Care | Payer: 59 | Attending: Obstetrics & Gynecology | Admitting: Obstetrics & Gynecology

## 2020-08-06 DIAGNOSIS — O209 Hemorrhage in early pregnancy, unspecified: Secondary | ICD-10-CM

## 2020-08-06 DIAGNOSIS — Z3A01 Less than 8 weeks gestation of pregnancy: Secondary | ICD-10-CM | POA: Insufficient documentation

## 2020-08-06 DIAGNOSIS — O034 Incomplete spontaneous abortion without complication: Secondary | ICD-10-CM | POA: Insufficient documentation

## 2020-08-06 NOTE — MAU Note (Signed)
Having lower abd cramps since this am. I have been throwing up and passing blood clots today. They were unsure if my pregnancy was going to progress or not.

## 2020-08-06 NOTE — MAU Note (Signed)
Patient reports to triage with complaints of cramping, with increased bright red bleeding, and quarter sized clots. Patient reports filling the overnight pads in less than an hour per change. Patient reports dizziness and feeling faint. Triage completed. Provider to be notified

## 2020-08-06 NOTE — MAU Provider Note (Signed)
Chief Complaint: Abdominal Pain and Vaginal Bleeding   Event Date/Time   First Provider Initiated Contact with Patient 08/06/20 2343        SUBJECTIVE HPI: Donna Sanders is a 34 y.o. [id number] at [redacted]w[redacted]d by LMP who presents to maternity admissions reporting increased bleeding with clots and cramping.  Had been seen last week and they saw a fetus with heartbeat and moderate SCH.Marland Kitchen Patient is planning a termination. . She denies vaginal bleeding, vaginal itching/burning, urinary symptoms, h/a, dizziness, n/v, or fever/chills.    Abdominal Pain This is a new problem. The current episode started in the past 7 days. The onset quality is gradual. The problem occurs intermittently. The problem has been unchanged. The quality of the pain is cramping. The abdominal pain does not radiate. Pertinent negatives include no constipation, diarrhea, dysuria, fever, headaches, myalgias, nausea or vomiting. Nothing aggravates the pain. The pain is relieved by nothing. She has tried nothing for the symptoms.  Vaginal Bleeding The patient's primary symptoms include pelvic pain and vaginal bleeding. The patient's pertinent negatives include no genital itching, genital lesions or genital odor. This is a recurrent problem. The current episode started in the past 7 days. The problem occurs constantly. The problem has been gradually worsening. Associated symptoms include abdominal pain. Pertinent negatives include no constipation, diarrhea, dysuria, fever, headaches, nausea or vomiting. The vaginal discharge was bloody. The vaginal bleeding is heavier than menses. She has been passing clots. She has been passing tissue. Nothing aggravates the symptoms. She has tried nothing for the symptoms.    RN note: Patient reports to triage with complaints of cramping, with increased bright red bleeding, and quarter sized clots. Patient reports filling the overnight pads in less than an hour per change. Patient reports dizziness and feeling  faint. Triage completed. Provider to be notified Past Medical History:  Diagnosis Date  . Asthma   . Migraine   . Obesity    Past Surgical History:  Procedure Laterality Date  . ANKLE ARTHROPLASTY     Social History   Socioeconomic History  . Marital status: Single    Spouse name: Not on file  . Number of children: Not on file  . Years of education: Not on file  . Highest education level: Not on file  Occupational History  . Not on file  Tobacco Use  . Smoking status: Current Some Day Smoker    Packs/day: 0.25    Types: Cigarettes  . Smokeless tobacco: Never Used  . Tobacco comment: 1 pack every 3 days   Vaping Use  . Vaping Use: Never used  Substance and Sexual Activity  . Alcohol use: Not Currently  . Drug use: No  . Sexual activity: Yes    Birth control/protection: None  Other Topics Concern  . Not on file  Social History Narrative  . Not on file   Social Determinants of Health   Financial Resource Strain: Not on file  Food Insecurity: Not on file  Transportation Needs: Not on file  Physical Activity: Not on file  Stress: Not on file  Social Connections: Not on file  Intimate Partner Violence: Not on file   No current facility-administered medications on file prior to encounter.   Current Outpatient Medications on File Prior to Encounter  Medication Sig Dispense Refill  . albuterol (PROVENTIL HFA;VENTOLIN HFA) 108 (90 BASE) MCG/ACT inhaler Inhale 2 puffs into the lungs every 6 (six) hours as needed for wheezing.      Allergies  Allergen Reactions  .  Penicillins Hives, Shortness Of Breath, Itching and Swelling    Throat swelling    I have reviewed patient's Past Medical Hx, Surgical Hx, Family Hx, Social Hx, medications and allergies.   ROS:  Review of Systems  Constitutional: Negative for fever.  Gastrointestinal: Positive for abdominal pain. Negative for constipation, diarrhea, nausea and vomiting.  Genitourinary: Positive for pelvic pain and  vaginal bleeding. Negative for dysuria.  Musculoskeletal: Negative for myalgias.  Neurological: Negative for headaches.   Review of Systems  Other systems negative   Physical Exam  Physical Exam Patient Vitals for the past 24 hrs:  BP Temp Pulse Resp Height Weight  08/06/20 2310 127/69 -- 79 -- -- --  08/06/20 2309 -- 98.6 F (37 C) -- 16 5\' 2"  (1.575 m) 125.2 kg   Constitutional: Well-developed, well-nourished female in no acute distress.  Cardiovascular: normal rate Respiratory: normal effort GI: Abd soft, non-tender. Pos BS x 4 MS: Extremities nontender, no edema, normal ROM Neurologic: Alert and oriented x 4.  GU: Neg CVAT.  PELVIC EXAM: Moderate clotted blood. Cervix slightly open  LAB RESULTS No results found for this or any previous visit (from the past 24 hour(s)).  IMAGING OB Transvaginal  Result Date: 08/07/2020 CLINICAL DATA:  Cramping, vaginal bleeding EXAM: TRANSVAGINAL OB ULTRASOUND TECHNIQUE: Transvaginal ultrasound was performed for complete evaluation of the gestation as well as the maternal uterus, adnexal regions, and pelvic cul-de-sac. COMPARISON:  07/29/2020 FINDINGS: Intrauterine gestational sac: Present Yolk sac:  Not identified Embryo:  Not identified Cardiac Activity: Not present Subchorionic hemorrhage:  Small, present Maternal uterus/adnexae: The uterus is anteverted. The uterus measures roughly 6.3 by 8.4 x 10.7 cm in greatest dimension. No intrauterine masses are seen. Within the endometrial cavity is a complex cystic lesion compatible with a gestational sac, as was seen on prior examination, however, the internal architecture has become more indistinct. In particular, a distinct fetal pole or yolk sac is not identifiable. No fetal heart rate is identified. A a small subchorionic hemorrhage is again identified. Together, the findings suggest changes of a incomplete abortion. The cervix is closed. There is no free fluid seen within the cul-de-sac. The  maternal ovaries are normal in size and echogenicity. No adnexal masses are seen. IMPRESSION: Increasing indistinctness and loss of normal fetal architecture and fetal heart rate involving the intrauterine gestational sac in keeping with and abortion in progress/incomplete abortion. Follow-up sonography is recommended in 7-10 days to document complete resolution. Electronically Signed   By: 9-10 MD   On: 08/07/2020 01:32   08/09/2020 OB LESS THAN 14 WEEKS WITH OB TRANSVAGINAL  Result Date: 07/29/2020 CLINICAL DATA:  Initial evaluation for acute vaginal bleeding, early pregnancy. EXAM: OBSTETRIC <14 WK 14/03/2020 AND TRANSVAGINAL OB US TECHNIQUE: Both transabdominal and transvaginal ultrasound examinations were performed for complete evaluation of the gestation as well as the maternal uterus, adnexal regions, and pelvic cul-de-sac. Transvaginal technique was performed to assess early pregnancy. COMPARISON:  None. FINDINGS: Intrauterine gestational sac: Single Yolk sac:  None visible Embryo:  Present Cardiac Activity: Present Heart Rate: 159 bpm CRL: 7.8 mm   6 w   4 d                  [place] EDC: 03/20/2021 Subchorionic hemorrhage: Moderate subchorionic hemorrhage measuring 2.6 x 2.2 x 2.2 cm without associated mass effect. Maternal uterus/adnexae: Ovaries normal in appearance bilaterally. No adnexal mass or free fluid. IMPRESSION: 1. Single viable intrauterine pregnancy as above, estimated gestational age [redacted]  weeks and 4 days by crown-rump length, with ultrasound EDC of 02/21/2021. 2. 2.6 x 2.2 x 2.2 cm subchorionic hemorrhage. 3. No other acute maternal uterine or adnexal abnormality. Electronically Signed   By: Rise Mu M.D.   On: 07/29/2020 18:35   .,  MAU Management/MDM: Ordered followup Ultrasound to rule out SAB This showed loss of fetal pole and collapsing gestational sac Discussed with patient Offered expectant management vs Cytotec for completion Undecided but will take Rx just in case she  decides to take meds this weekend  ASSESSMENT 1. Bleeding in early pregnancy   2.     Inevitable but incomplete abortion  PLAN Discharge home Bleeding precautions Discussed natural course vs Cytotec  Rx sent for Cytotec and Percocet for pain Will recheck HCG next week and weekly until 0 Pt stable at time of discharge. Encouraged to return here if she develops worsening of symptoms, increase in pain, fever, or other concerning symptoms.    Wynelle Bourgeois CNM, MSN Certified Nurse-Midwife 08/06/2020  11:43 PM

## 2020-08-07 ENCOUNTER — Inpatient Hospital Stay (HOSPITAL_COMMUNITY): Payer: 59

## 2020-08-07 DIAGNOSIS — Z3A01 Less than 8 weeks gestation of pregnancy: Secondary | ICD-10-CM | POA: Diagnosis not present

## 2020-08-07 DIAGNOSIS — O034 Incomplete spontaneous abortion without complication: Secondary | ICD-10-CM | POA: Diagnosis not present

## 2020-08-07 MED ORDER — OXYCODONE-ACETAMINOPHEN 5-325 MG PO TABS
1.0000 | ORAL_TABLET | ORAL | 0 refills | Status: DC | PRN
Start: 2020-08-07 — End: 2021-10-08

## 2020-08-07 MED ORDER — MISOPROSTOL 200 MCG PO TABS: ORAL_TABLET | ORAL | 1 refills | Status: DC

## 2020-08-07 NOTE — Discharge Instructions (Signed)
Incomplete Miscarriage A miscarriage is the loss of an unborn baby (fetus) before the 20th week of pregnancy. In an incomplete miscarriage, parts of the fetus or placenta (afterbirth) remain in the body. Most miscarriages happen in the first 3 months of pregnancy. Sometimes, it happens before a woman even knows she is pregnant. Having a miscarriage can be an emotional experience. If you have had a miscarriage, talk with your health care provider about any questions you may have about miscarrying, the grieving process, and your future pregnancy plans. What are the causes? This condition may be caused by:  Problems with the genes or chromosomes that make it impossible for the baby to develop normally. These problems are most often the result of random errors that occur early in development, and are not passed from parent to child (not inherited).  Infection of the cervix or uterus.  Conditions that affect hormone balance in the body.  Problems with the cervix, such as the cervix opening and thinning before pregnancy is at term (cervical insufficiency).  Problems with the uterus, such as a uterus with an abnormal shape, fibroids in the uterus, or problems that were present from birth (congenital abnormalities).  Certain medical conditions.  Smoking, drinking alcohol, or using drugs.  Injury (trauma). Many times, the cause of a miscarriage is not known. What are the signs or symptoms? Symptoms of this condition include:  Vaginal bleeding or spotting, with or without cramps or pain.  Pain or cramping in the abdomen or lower back.  Passing fluid, tissue, or blood clots from the vagina. How is this diagnosed? This condition may be diagnosed based on:  A physical exam.  Ultrasound.  Blood tests.  Urine tests. How is this treated? An incomplete miscarriage may be treated with:  Dilation and curettage (D&C). This is a procedure in which the cervix is stretched open and the lining of  the uterus (endometrium) is scraped to remove any remaining tissue from the pregnancy.  Medicines, such as: ? Antibiotic medicine to treat infection. ? Medicine to help any remaining tissue pass out of your uterus. ? Medicine to reduce (contract) the size of the uterus. These medicines may be given if you have a lot of bleeding. If you have Rh negative blood and your baby was Rh positive, you will need a shot of medicine called Rh immunoglobulinto protect future babies from Rh blood problems. "Rh-negative" and "Rh-positive" refer to whether or not the blood has a specific protein found on the surface of red blood cells (Rh factor). Follow these instructions at home: Medicines   Take over-the-counter and prescription medicines only as told by your health care provider.  If you were prescribed antibiotic medicine, take your antibiotic as told by your health care provider. Do not stop taking the antibiotic even if you start to feel better.  Do not take NSAIDs, such as aspirin and ibuprofen, unless approved by your doctor. These medicines can cause bleeding. Activity  Rest as directed. Ask your health care provider what activities are safe for you.  Have someone help with home and family responsibilities during this time. General instructions  Keep track of the number of sanitary pads you use each day and how soaked (saturated) they are. Write down this information.  Monitor the amount of tissue or blood clots that you pass from your vagina. Save any large amounts of tissue for your health care provider to examine.  Do not use tampons, douche, or have sex until your health care provider   approves.  To help you and your partner with the process of grieving, talk with your health care provider or seek counseling to help cope with the pregnancy loss.  When you are ready, meet with your health care provider to discuss important steps you should take for your health, as well as steps to take in  order to have a healthy pregnancy in the future.  Keep all follow-up visits as told by your health care provider. This is important. Where to find more information  The American Congress of Obstetricians and Gynecologists: www.acog.org  [place] Department of Health and Human Services Office of Women's Health: www.womenshealth.gov Contact a health care provider if:  You have a fever or chills.  You have a foul smelling vaginal discharge. Get help right away if:  You have severe cramps or pain in your back or abdomen.  You pass walnut-sized (or larger) blood clots or tissue from your vagina.  You have heavy bleeding, soaking more than 1 regular sanitary pad in an hour.  You become lightheaded or weak.  You pass out.  You have feelings of sadness that take over your thoughts, or you have thoughts of hurting yourself. Summary  In an incomplete miscarriage, parts of the fetus or placenta (afterbirth) remain in the body.  There are multiple treatment options for an incomplete miscarriage, talk to your health care provider about the best option for you.  Follow your health care provider's instructions for follow-up care.  To help you and your partner with the process of grieving, talk with your health care provider or seek counseling to help cope with the pregnancy loss. This information is not intended to replace advice given to you by your health care provider. Make sure you discuss any questions you have with your health care provider. Document Revised: 09/14/2017 Document Reviewed: 09/14/2016 Elsevier Patient Education  2020 [employer]  

## 2020-08-13 ENCOUNTER — Other Ambulatory Visit: Payer: Medicaid Other

## 2020-08-13 ENCOUNTER — Other Ambulatory Visit: Payer: Self-pay

## 2020-08-13 DIAGNOSIS — O034 Incomplete spontaneous abortion without complication: Secondary | ICD-10-CM

## 2020-08-14 ENCOUNTER — Inpatient Hospital Stay (HOSPITAL_COMMUNITY): Payer: 59

## 2020-08-14 ENCOUNTER — Encounter (HOSPITAL_COMMUNITY): Payer: Self-pay | Admitting: Obstetrics and Gynecology

## 2020-08-14 ENCOUNTER — Inpatient Hospital Stay (HOSPITAL_COMMUNITY)
Admission: AD | Admit: 2020-08-14 | Discharge: 2020-08-14 | Disposition: A | Discharge status: Home / Self Care | Payer: 59 | Attending: Obstetrics and Gynecology | Admitting: Obstetrics and Gynecology

## 2020-08-14 DIAGNOSIS — O034 Incomplete spontaneous abortion without complication: Secondary | ICD-10-CM | POA: Diagnosis present

## 2020-08-14 DIAGNOSIS — Z20822 Contact with and (suspected) exposure to covid-19: Secondary | ICD-10-CM | POA: Diagnosis not present

## 2020-08-14 DIAGNOSIS — F17210 Nicotine dependence, cigarettes, uncomplicated: Secondary | ICD-10-CM | POA: Insufficient documentation

## 2020-08-14 DIAGNOSIS — O99331 Smoking (tobacco) complicating pregnancy, first trimester: Secondary | ICD-10-CM | POA: Diagnosis not present

## 2020-08-14 DIAGNOSIS — Z79899 Other long term (current) drug therapy: Secondary | ICD-10-CM | POA: Insufficient documentation

## 2020-08-14 DIAGNOSIS — Z3A08 8 weeks gestation of pregnancy: Secondary | ICD-10-CM | POA: Insufficient documentation

## 2020-08-14 DIAGNOSIS — Z880 Allergy status to penicillin: Secondary | ICD-10-CM | POA: Insufficient documentation

## 2020-08-14 DIAGNOSIS — O09521 Supervision of elderly multigravida, first trimester: Secondary | ICD-10-CM | POA: Diagnosis not present

## 2020-08-14 LAB — BETA HCG QUANT (REF LAB): hCG Quant: 74 m[IU]/mL

## 2020-08-14 LAB — CBC WITH DIFFERENTIAL/PLATELET
Abs Immature Granulocytes: 0.03 10*3/uL (ref 0.00–0.07)
Basophils Absolute: 0 10*3/uL (ref 0.0–0.1)
Basophils Relative: 0 %
Eosinophils Absolute: 0.2 10*3/uL (ref 0.0–0.5)
Eosinophils Relative: 2 %
HCT: 31.3 % — ABNORMAL LOW (ref 36.0–46.0)
Hemoglobin: 9.7 g/dL — ABNORMAL LOW (ref 12.0–15.0)
Immature Granulocytes: 0 %
Lymphocytes Relative: 23 %
Lymphs Abs: 1.9 10*3/uL (ref 0.7–4.0)
MCH: 20 pg — ABNORMAL LOW (ref 26.0–34.0)
MCHC: 31 g/dL (ref 30.0–36.0)
MCV: 64.7 fL — ABNORMAL LOW (ref 80.0–100.0)
Monocytes Absolute: 0.7 10*3/uL (ref 0.1–1.0)
Monocytes Relative: 8 %
Neutro Abs: 5.4 10*3/uL (ref 1.7–7.7)
Neutrophils Relative %: 67 %
Platelets: 289 10*3/uL (ref 150–400)
RBC: 4.84 MIL/uL (ref 3.87–5.11)
RDW: 16.9 % — ABNORMAL HIGH (ref 11.5–15.5)
WBC: 8.2 10*3/uL (ref 4.0–10.5)
nRBC: 0 % (ref 0.0–0.2)

## 2020-08-14 LAB — RESP PANEL BY RT-PCR (FLU A&B, COVID) ARPGX2
Influenza A by PCR: NEGATIVE
Influenza B by PCR: NEGATIVE
SARS Coronavirus 2 by RT PCR: NEGATIVE

## 2020-08-14 MED ORDER — IBUPROFEN 800 MG PO TABS: 800.0000 mg | ORAL_TABLET | Freq: Once | ORAL | Status: DC

## 2020-08-14 MED ORDER — ACETAMINOPHEN 500 MG PO TABS
1000.0000 mg | ORAL_TABLET | Freq: Once | ORAL | Status: AC
  Administered 2020-08-14: 15:00:00 1000 mg via ORAL
  Filled 2020-08-14: qty 2

## 2020-08-14 NOTE — Discharge Instructions (Signed)

## 2020-08-14 NOTE — MAU Note (Signed)
Was here last wk, was "given the medicine to finish cleaning  Her out."  Took the  Medicine Friday night. Still cramping and bleeding, is bleeds through pad in an hour, been seeing big blood clots.

## 2020-08-14 NOTE — MAU Provider Note (Addendum)
History     CSN: [id number]  Arrival date and time: 08/14/20 1342   Event Date/Time   First Provider Initiated Contact with Patient 08/14/20 1434      Chief Complaint  Patient presents with  . Abdominal Pain  . Vaginal Bleeding   Donna Sanders is a 34 y.o. F7P1025 at [redacted]w[redacted]d with known incomplete miscarriage.  She has been followed at the [facility] office.  She presents today for Abdominal Pain and Vaginal Bleeding.  She states she has been "soaking up overnight pads" for the past 3 days.  She states she has continued to pass big clots and soaked through her clothes while working.  She states the clot was "like a fist."  She states the bleeding today "is still the same." She feels she is soaking through pads every 30-60 minutes.  She reports pain "in my cervix" and "my stomach is cramping real bad."  Patient rates the pain a 8/10 and "when I use the bathroom I have pressure.  She reports the pain is improved with heat and worsened with standing.       OB History    Gravida  5   Para  4   Term  4   Preterm      AB      Living  4     SAB      IAB      Ectopic      Multiple      Live Births  4           Past Medical History:  Diagnosis Date  . Asthma   . Migraine   . Obesity     Past Surgical History:  Procedure Laterality Date  . ANKLE ARTHROPLASTY      Family History  Problem Relation Age of Onset  . Asthma Mother   . Cancer Mother   . Depression Mother   . Migraines Mother     Social History   Tobacco Use  . Smoking status: Current Some Day Smoker    Packs/day: 0.25    Types: Cigarettes  . Smokeless tobacco: Never Used  . Tobacco comment: 1 pack every 3 days   Vaping Use  . Vaping Use: Never used  Substance Use Topics  . Alcohol use: Not Currently  . Drug use: No    Allergies:  Allergies  Allergen Reactions  . Penicillins Hives, Shortness Of Breath, Itching and Swelling    Throat swelling    Medications Prior to Admission   Medication Sig Dispense Refill Last Dose  . albuterol (PROVENTIL HFA;VENTOLIN HFA) 108 (90 BASE) MCG/ACT inhaler Inhale 2 puffs into the lungs every 6 (six) hours as needed for wheezing.    Past Month at Unknown time  . misoprostol (CYTOTEC) 200 MCG tablet Place four tablets in between your gums and cheeks (two tablets on each side) as instructed 4 tablet 1   . oxyCODONE-acetaminophen (PERCOCET) 5-325 MG tablet Take 1 tablet by mouth every 4 (four) hours as needed for up to 6 doses for severe pain. 6 tablet 0     Review of Systems  Constitutional: Negative for chills and fever.  Respiratory: Negative for cough, chest tightness and shortness of breath.   Cardiovascular: Negative for chest pain.  Gastrointestinal: Positive for abdominal pain. Negative for nausea and vomiting.  Genitourinary: Positive for vaginal bleeding. Negative for difficulty urinating and dysuria.  Neurological: Negative for dizziness, light-headedness and headaches.   Physical Exam   Blood pressure  133/78, pulse 80, temperature 98.5 F (36.9 C), temperature source Oral, resp. rate 18, height 5\' 2"  (1.575 m), weight 123.7 kg, last menstrual period 05/26/2020, SpO2 99 %, unknown if currently breastfeeding.  Physical Exam Vitals reviewed. Exam conducted with a chaperone present.  Constitutional:      General: She is not in acute distress.    Appearance: Normal appearance. She is well-developed. She is obese. She is not toxic-appearing.  HENT:     Head: Normocephalic and atraumatic.  Eyes:     Conjunctiva/sclera: Conjunctivae normal.  Cardiovascular:     Rate and Rhythm: Normal rate and regular rhythm.     Heart sounds: Normal heart sounds.  Pulmonary:     Effort: Pulmonary effort is normal. No respiratory distress.     Breath sounds: Normal breath sounds.  Abdominal:     General: Bowel sounds are normal.     Palpations: Abdomen is soft.     Tenderness: There is abdominal tenderness in the right upper quadrant,  left upper quadrant and left lower quadrant.  Genitourinary:    Comments:  Speculum Exam: -Normal External Genitalia: Non tender, blood noted on gluteal folds and at introitus -Vaginal Vault: Pink mucosa with good rugae. Clot removed with kelly forceps. Blood removed with 2 faux swabs.   -Cervix:Pink, no lesions, cysts, or polyps.  Appears open and clot teased out with no active bleeding from os -Bimanual Exam:  Deferred  Musculoskeletal:     Cervical back: Normal range of motion.  Skin:    General: Skin is warm and dry.  Neurological:     Mental Status: She is alert and oriented to person, place, and time.  Psychiatric:        Mood and Affect: Mood normal.        Behavior: Behavior normal.        Thought Content: Thought content normal.     MAU Course  Procedures Results for orders placed or performed during the hospital encounter of 08/14/20 (from the past 24 hour(s))  CBC with Differential/Platelet     Status: Abnormal   Collection Time: 08/14/20  2:42 PM  Result Value Ref Range   WBC 8.2 4.0 - 10.5 K/uL   RBC 4.84 3.87 - 5.11 MIL/uL   Hemoglobin 9.7 (L) 12.0 - 15.0 g/dL   HCT 08/16/20 (L) 25.0 - 53.9 %   MCV 64.7 (L) 80.0 - 100.0 fL   MCH 20.0 (L) 26.0 - 34.0 pg   MCHC 31.0 30.0 - 36.0 g/dL   RDW 76.7 (H) 34.1 - 93.7 %   Platelets 289 150 - 400 K/uL   nRBC 0.0 0.0 - 0.2 %   Neutrophils Relative % 67 %   Neutro Abs 5.4 1.7 - 7.7 K/uL   Lymphocytes Relative 23 %   Lymphs Abs 1.9 0.7 - 4.0 K/uL   Monocytes Relative 8 %   Monocytes Absolute 0.7 0.1 - 1.0 K/uL   Eosinophils Relative 2 %   Eosinophils Absolute 0.2 0.0 - 0.5 K/uL   Basophils Relative 0 %   Basophils Absolute 0.0 0.0 - 0.1 K/uL   Immature Granulocytes 0 %   Abs Immature Granulocytes 0.03 0.00 - 0.07 K/uL   90.2 OB Transvaginal  Result Date: 08/14/2020 CLINICAL DATA:  34 year old female with incomplete miscarriage. Vaginal bleeding. EXAM: TRANSVAGINAL OB ULTRASOUND TECHNIQUE: Transvaginal ultrasound was  performed for complete evaluation of the gestation as well as the maternal uterus, adnexal regions, and pelvic cul-de-sac. COMPARISON:  None. FINDINGS: The uterus is  anteverted. The uterus demonstrates a heterogeneous echotexture without a focal mass which may be related to underlying adenomyosis. The endometrium is suboptimally visualized. The fundal endometrium measures 8 mm in thickness. On cine images echogenic mobile content noted within the endometrium with probable small amount of flow. Findings may represent blood product, although the presence of flow suggest possibility of retained product of conception. Clinical correlation and follow-up with serial HCG levels and repeat ultrasound as clinically indicated, recommended. The ovaries are unremarkable. No significant free fluid in the pelvis. IMPRESSION: No intrauterine pregnancy identified. Echogenic content within the endometrium may represent blood product or retained product of conception. Clinical correlation is recommended. Electronically Signed   By: Elgie Collard M.D.   On: 08/14/2020 15:59    MDM Lab: CBC Ultrasound Pain Medication Assessment and Plan  34 year old G5P4004 at 8.6wks Incomplete Miscarriage  -POC Reviewed -Labs reviewed, ordered, and collected. -Exam performed and findings discussed. -Patient offered and accepts pain medication. -Ibuprofen 800mg  ordered, but patient reports it makes her drowsy.  Agreeable to tylenol instead.  -Will send for TVUS.   08/14/2020, 2:34 PM   Reassessment (4:07 PM)  -Results return as above. -Dr. 08/16/2020 consulted and informed of patient status, results, and option of D&C vs Outpt Cytotec. *Advised give patient option for D&C now or cytotec. -Provider to bedside and options discussed. -Patient opts for surgery. -Dr. Hazle Coca notified and states he will be at bedside shortly.   Reassessment (4:21 PM)  -Dr. Donavan Foil at bedside and completes exam. *Discussed findings  with patient. *Advises discharge and weekly follow up in office. -Bleeding precautions given. -Encouraged to call or return to MAU if symptoms worsen or with the onset of new symptoms. -Discharged to home in stable condition.  Donavan Foil MSN, CNM Advanced Practice Provider, [facility]

## 2020-08-19 ENCOUNTER — Other Ambulatory Visit: Payer: Self-pay

## 2020-08-19 ENCOUNTER — Other Ambulatory Visit: Payer: Medicaid Other

## 2020-08-19 DIAGNOSIS — O034 Incomplete spontaneous abortion without complication: Secondary | ICD-10-CM

## 2020-08-20 ENCOUNTER — Other Ambulatory Visit: Payer: Medicaid Other

## 2020-08-20 LAB — BETA HCG QUANT (REF LAB): hCG Quant: 8 m[IU]/mL

## 2020-08-24 ENCOUNTER — Ambulatory Visit: Payer: Medicaid Other | Admitting: Advanced Practice Midwife

## 2021-02-28 IMAGING — US US OB TRANSVAGINAL
1 series · 15 of 28 positions shown · non-contrast
Comparison: None.

CLINICAL DATA: 34-year-old female with incomplete miscarriage.
Vaginal bleeding.

EXAM:
TRANSVAGINAL OB ULTRASOUND
TECHNIQUE: Transvaginal ultrasound was performed for complete evaluation of the
gestation as well as the maternal uterus, adnexal regions, and
pelvic cul-de-sac.

[Series 1: us ob transvaginal · 15 of 28 slices shown]
[im 1/28]
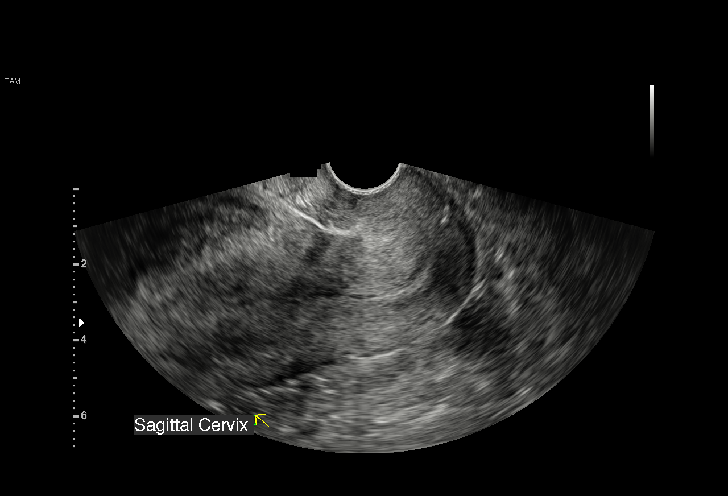
[im 3/28]
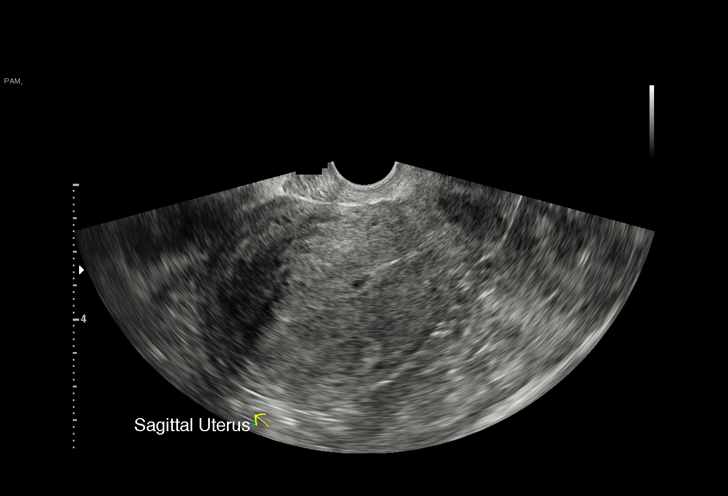
[im 5/28]
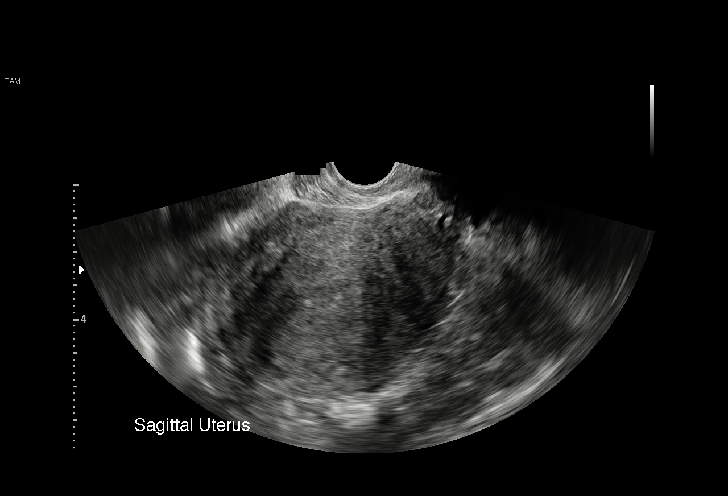
[im 7/28]
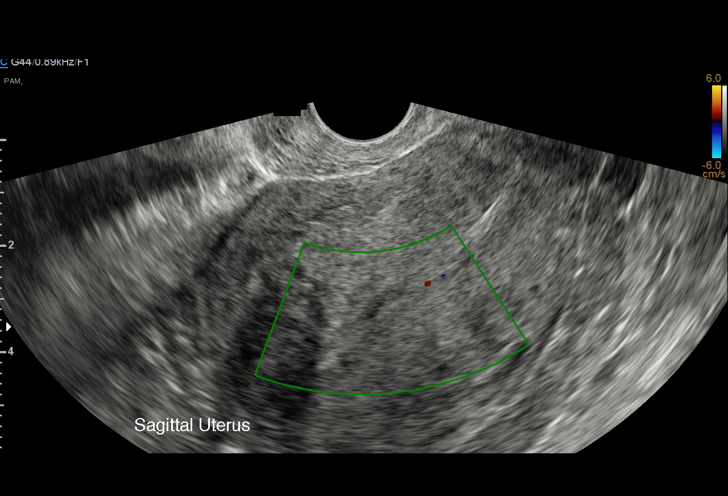
[im 9/28]
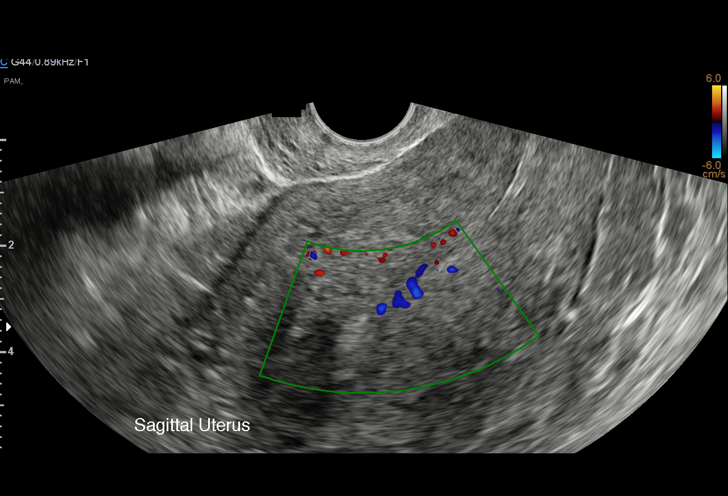
[im 11/28]
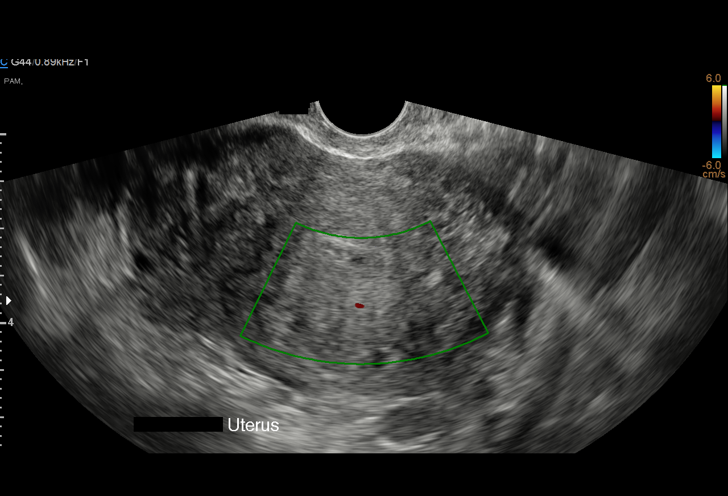
[im 13/28]
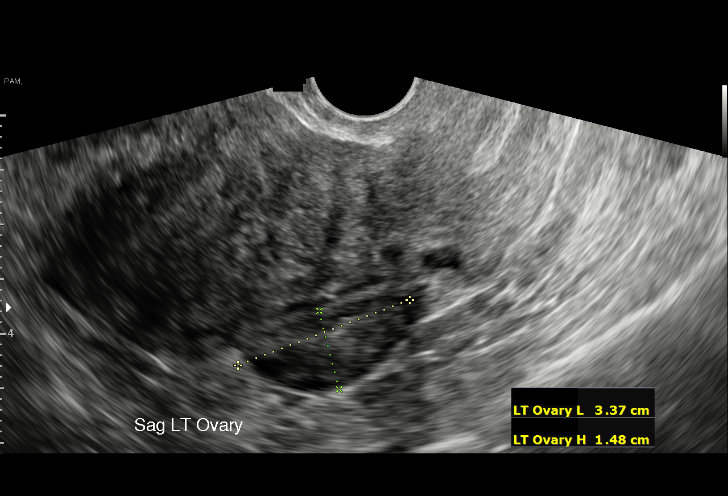
[im 15/28]
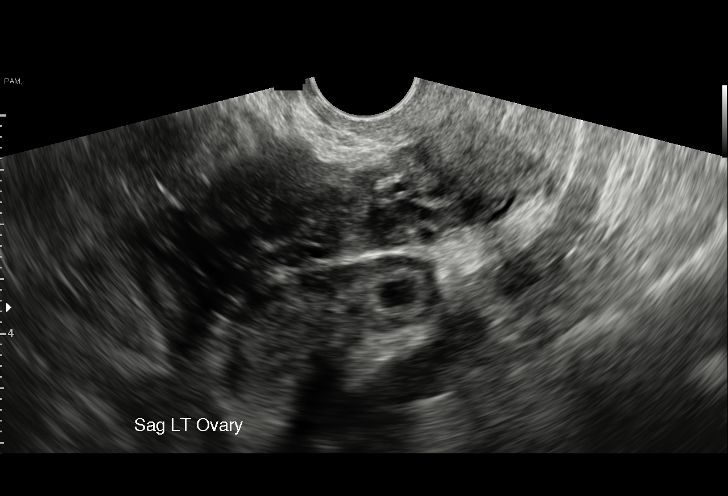
[im 16/28]
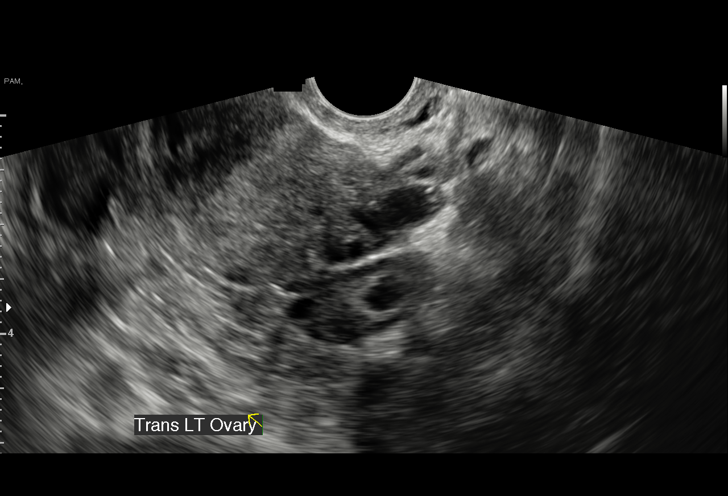
[im 18/28]
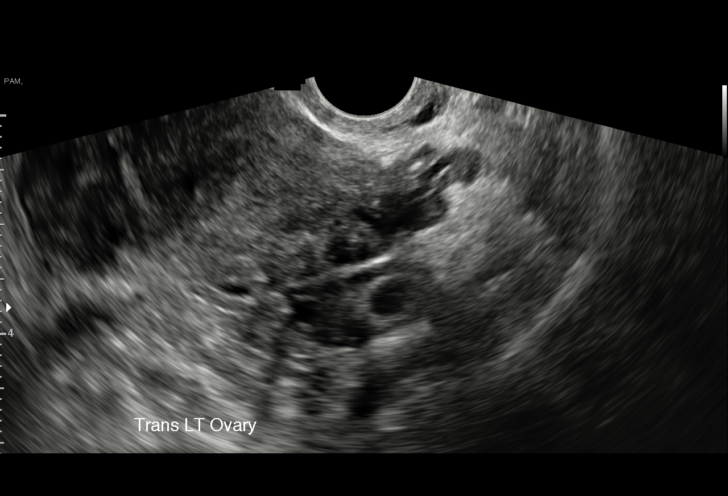
[im 20/28]
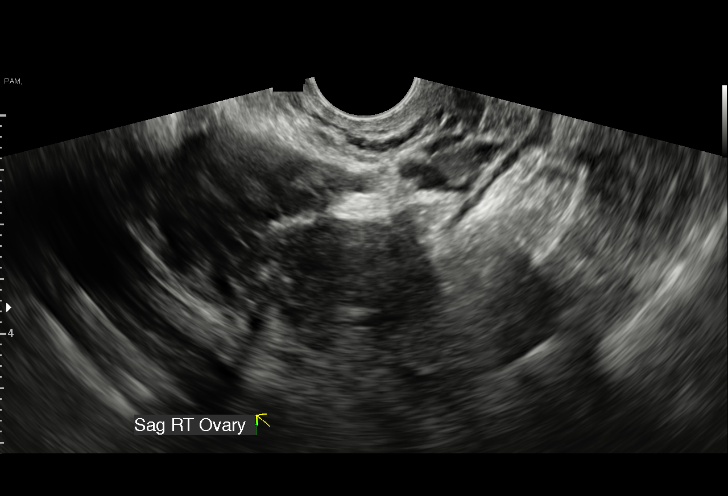
[im 22/28]
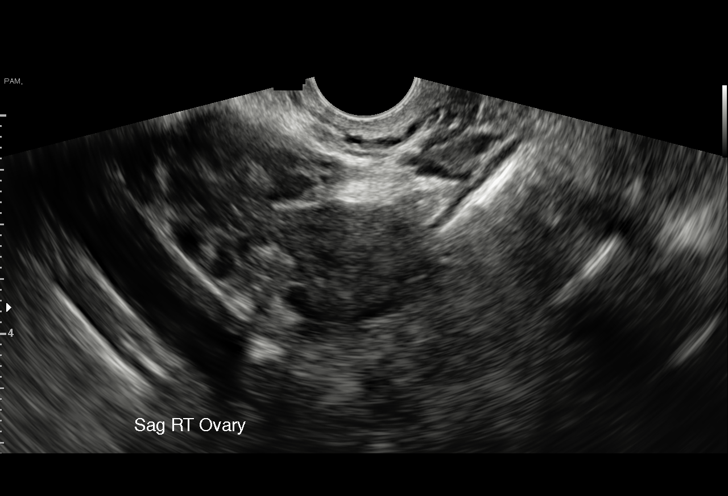
[im 24/28]
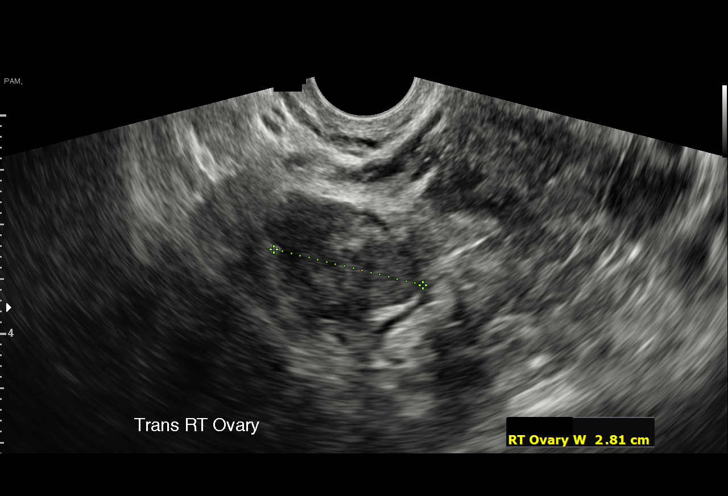
[im 26/28]
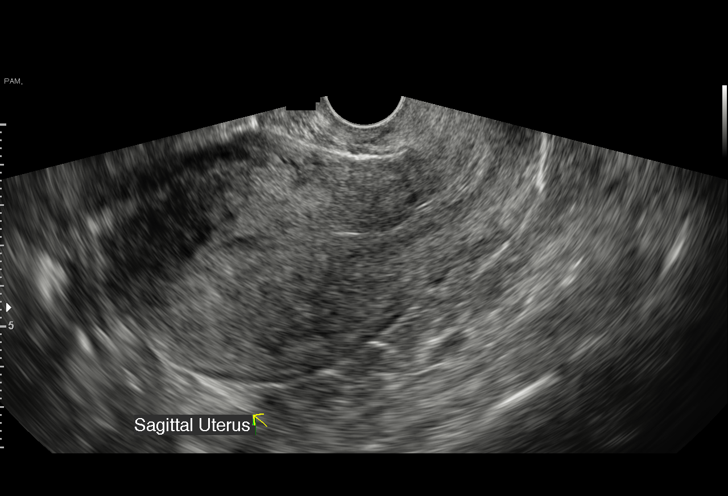
[im 28/28]
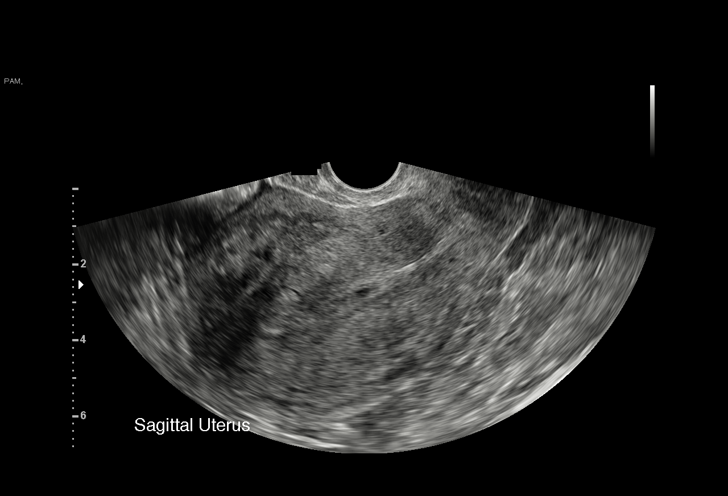

[15 of 28 positions shown; findings below may reference images not displayed]

FINDINGS: The uterus is anteverted. The uterus demonstrates a heterogeneous
echotexture without a focal mass which may be related to underlying
adenomyosis.

The endometrium is suboptimally visualized. The fundal endometrium
measures 8 mm in thickness. On cine images echogenic mobile content
noted within the endometrium with probable small amount of flow.
Findings may represent blood product, although the presence of flow
suggest possibility of retained product of conception. Clinical
correlation and follow-up with serial HCG levels and repeat
ultrasound as clinically indicated, recommended.

The ovaries are unremarkable.

No significant free fluid in the pelvis.
IMPRESSION: No intrauterine pregnancy identified. Echogenic content within the
endometrium may represent blood product or retained product of
conception. Clinical correlation is recommended.

## 2021-04-19 ENCOUNTER — Ambulatory Visit: Payer: 59 | Admitting: Obstetrics

## 2021-04-19 ENCOUNTER — Encounter: Payer: Self-pay | Admitting: Obstetrics

## 2021-04-19 ENCOUNTER — Ambulatory Visit: Payer: Medicaid Other | Admitting: Obstetrics

## 2021-04-22 ENCOUNTER — Ambulatory Visit (INDEPENDENT_AMBULATORY_CARE_PROVIDER_SITE_OTHER): Payer: Medicaid Other | Admitting: Obstetrics

## 2021-04-22 ENCOUNTER — Other Ambulatory Visit: Payer: Self-pay

## 2021-04-22 ENCOUNTER — Encounter: Payer: Self-pay | Admitting: Obstetrics

## 2021-04-22 ENCOUNTER — Other Ambulatory Visit (HOSPITAL_COMMUNITY)
Admission: RE | Admit: 2021-04-22 | Discharge: 2021-04-22 | Disposition: A | Discharge status: Home / Self Care | Payer: Medicaid Other | Source: Ambulatory Visit | Attending: Obstetrics | Admitting: Obstetrics

## 2021-04-22 VITALS — BP 127/85 | HR 91 | Wt 277.0 lb

## 2021-04-22 DIAGNOSIS — Z3202 Encounter for pregnancy test, result negative: Secondary | ICD-10-CM

## 2021-04-22 DIAGNOSIS — Z6841 Body Mass Index (BMI) 40.0 and over, adult: Secondary | ICD-10-CM

## 2021-04-22 DIAGNOSIS — N898 Other specified noninflammatory disorders of vagina: Secondary | ICD-10-CM | POA: Diagnosis not present

## 2021-04-22 DIAGNOSIS — L732 Hidradenitis suppurativa: Secondary | ICD-10-CM | POA: Diagnosis not present

## 2021-04-22 LAB — POCT URINE PREGNANCY: Preg Test, Ur: NEGATIVE

## 2021-04-22 MED ORDER — CLINDAMYCIN PHOSPHATE 1 % EX SOLN: Freq: Two times a day (BID) | CUTANEOUS | 4 refills | Status: DC

## 2021-04-22 MED ORDER — METRONIDAZOLE 500 MG PO TABS: 500.0000 mg | ORAL_TABLET | Freq: Two times a day (BID) | ORAL | 2 refills | Status: DC

## 2021-04-22 MED ORDER — FLUCONAZOLE 150 MG PO TABS: 150.0000 mg | ORAL_TABLET | Freq: Once | ORAL | 0 refills | Status: AC

## 2021-04-22 NOTE — Progress Notes (Signed)
Patient ID: Donna Sanders, female   DOB: [birth year], 35 y.o.   MRN: [id number]  Chief Complaint  Patient presents with   Gynecologic Exam    HPI [name] is a 35 y.o. female.  Complains of vaginal discharge with odor and itching.  Also c/o "boil" under left arm.  She has a history of hidradenitis axilla. HPI  Past Medical History:  Diagnosis Date   Asthma    Migraine    Obesity     Past Surgical History:  Procedure Laterality Date   ANKLE ARTHROPLASTY      Family History  Problem Relation Age of Onset   Asthma Mother    Cancer Mother    Depression Mother    Migraines Mother     Social History Social History   Tobacco Use   Smoking status: Some Days    Packs/day: 0.25    Types: Cigarettes   Smokeless tobacco: Never   Tobacco comments:    1 pack every 3 days   Vaping Use   Vaping Use: Never used  Substance Use Topics   Alcohol use: Not Currently   Drug use: No    Allergies  Allergen Reactions   Penicillins Hives, Shortness Of Breath, Itching and Swelling    Throat swelling    Current Outpatient Medications  Medication Sig Dispense Refill   fluconazole (DIFLUCAN) 150 MG tablet Take 1 tablet (150 mg total) by mouth once for 1 dose. 1 tablet 0   metroNIDAZOLE (FLAGYL) 500 MG tablet Take 1 tablet (500 mg total) by mouth 2 (two) times daily. 14 tablet 2   albuterol (PROVENTIL HFA;VENTOLIN HFA) 108 (90 BASE) MCG/ACT inhaler Inhale 2 puffs into the lungs every 6 (six) hours as needed for wheezing.  (Patient not taking: Reported on 04/22/2021)     clindamycin (CLEOCIN-T) 1 % external solution Apply topically 2 (two) times daily. 60 mL 4   oxyCODONE-acetaminophen (PERCOCET) 5-325 MG tablet Take 1 tablet by mouth every 4 (four) hours as needed for up to 6 doses for severe pain. (Patient not taking: Reported on 04/22/2021) 6 tablet 0   No current facility-administered medications for this visit.    Review of Systems Review of Systems Constitutional: negative for  fatigue and weight loss Respiratory: negative for cough and wheezing Cardiovascular: negative for chest pain, fatigue and palpitations Gastrointestinal: negative for abdominal pain and change in bowel habits Genitourinary: positive for vaginal discharge with odor and itching Integument/breast: positive for hidradenitis lesion in left axilla.  negative for nipple discharge Musculoskeletal:negative for myalgias Neurological: negative for gait problems and tremors Behavioral/Psych: negative for abusive relationship, depression Endocrine: negative for temperature intolerance      Blood pressure 127/85, pulse 91, weight 277 lb (125.6 kg), last menstrual period 03/29/2021, unknown if currently breastfeeding.  Physical Exam Physical Exam General:   Alert and no distress  Skin:   no rash or abnormalities  Lungs:   clear to auscultation bilaterally  Heart:   regular rate and rhythm, S1, S2 normal, no murmur, click, rub or gallop  Breasts:   normal without suspicious masses, skin or nipple changes or axillary nodes  Abdomen:  normal findings: no organomegaly, soft, non-tender and no hernia  Pelvis:  External genitalia: normal general appearance Urinary system: urethral meatus normal and bladder without fullness, nontender Vaginal: normal without tenderness, induration or masses Cervix: normal appearance Adnexa: normal bimanual exam Uterus: anteverted and non-tender, normal size            Left  axilla:  hidradenitis lesion with a small tract superficially   I have spent a total of 20 minutes of face-to-face time, excluding clinical staff time, reviewing notes and preparing to see patient, ordering tests and/or medications, and counseling the patient.   Data Reviewed Labs Wet Prep  Assessment     1. Vaginal discharge Rx: - Cervicovaginal ancillary only( [place]) - metroNIDAZOLE (FLAGYL) 500 MG tablet; Take 1 tablet (500 mg total) by mouth 2 (two) times daily.  Dispense: 14 tablet;  Refill: 2 - fluconazole (DIFLUCAN) 150 MG tablet; Take 1 tablet (150 mg total) by mouth once for 1 dose.  Dispense: 1 tablet; Refill: 0  2. Hidradenitis suppurativa of left axilla Rx: - clindamycin (CLEOCIN-T) 1 % external solution; Apply topically 2 (two) times daily.  Dispense: 60 mL; Refill: 4  3. Class 3 severe obesity due to excess calories without serious comorbidity with body mass index (BMI) of 50.0 to 59.9 in adult (HCC) - weight reduction with the aid of dietary changes, exercise and behavioral modification recommended  4. Pregnancy test  Rx: - POCT urine pregnancy:  Negative     Plan   Follow up in 3 months for Annual / Pap  Orders Placed This Encounter  Procedures   POCT urine pregnancy   Meds ordered this encounter  Medications   clindamycin (CLEOCIN-T) 1 % external solution    Sig: Apply topically 2 (two) times daily.    Dispense:  60 mL    Refill:  4   metroNIDAZOLE (FLAGYL) 500 MG tablet    Sig: Take 1 tablet (500 mg total) by mouth 2 (two) times daily.    Dispense:  14 tablet    Refill:  2   fluconazole (DIFLUCAN) 150 MG tablet    Sig: Take 1 tablet (150 mg total) by mouth once for 1 dose.    Dispense:  1 tablet    Refill:  0      Brock Bad, MD 04/22/2021 9:36 AM

## 2021-04-22 NOTE — Progress Notes (Signed)
Pt states she is having vaginal d/c and would like to have boil looked at in axilla area. Pt also request STD and UPT today.

## 2021-04-23 LAB — CERVICOVAGINAL ANCILLARY ONLY
Bacterial Vaginitis (gardnerella): POSITIVE — AB
Candida Glabrata: NEGATIVE
Candida Vaginitis: NEGATIVE
Chlamydia: NEGATIVE
Comment: NEGATIVE
Comment: NEGATIVE
Comment: NEGATIVE
Comment: NEGATIVE
Comment: NEGATIVE
Comment: NORMAL
Neisseria Gonorrhea: NEGATIVE
Trichomonas: POSITIVE — AB

## 2021-04-24 ENCOUNTER — Other Ambulatory Visit: Payer: Self-pay | Admitting: Obstetrics

## 2021-04-29 ENCOUNTER — Other Ambulatory Visit: Payer: Self-pay | Admitting: *Deleted

## 2021-04-29 DIAGNOSIS — N898 Other specified noninflammatory disorders of vagina: Secondary | ICD-10-CM

## 2021-04-29 NOTE — Progress Notes (Signed)
error 

## 2021-07-22 ENCOUNTER — Ambulatory Visit: Payer: Medicaid Other | Admitting: Obstetrics

## 2021-09-29 ENCOUNTER — Other Ambulatory Visit: Payer: Self-pay

## 2021-09-29 ENCOUNTER — Encounter (HOSPITAL_COMMUNITY): Payer: Self-pay | Admitting: Emergency Medicine

## 2021-09-29 ENCOUNTER — Ambulatory Visit (HOSPITAL_COMMUNITY)
Admission: EM | Admit: 2021-09-29 | Discharge: 2021-09-29 | Disposition: A | Discharge status: Home / Self Care | Payer: Medicaid Other | Attending: Emergency Medicine | Admitting: Emergency Medicine

## 2021-09-29 DIAGNOSIS — J309 Allergic rhinitis, unspecified: Secondary | ICD-10-CM | POA: Diagnosis not present

## 2021-09-29 DIAGNOSIS — J45901 Unspecified asthma with (acute) exacerbation: Secondary | ICD-10-CM | POA: Diagnosis not present

## 2021-09-29 MED ORDER — ALBUTEROL SULFATE HFA 108 (90 BASE) MCG/ACT IN AERS
INHALATION_SPRAY | RESPIRATORY_TRACT | Status: AC
  Filled 2021-09-29: qty 6.7

## 2021-09-29 MED ORDER — AEROCHAMBER PLUS FLO-VU LARGE MISC
Status: AC
  Filled 2021-09-29: qty 1

## 2021-09-29 MED ORDER — ALBUTEROL SULFATE HFA 108 (90 BASE) MCG/ACT IN AERS
2.0000 | INHALATION_SPRAY | Freq: Once | RESPIRATORY_TRACT | Status: AC
  Administered 2021-09-29: 2 via RESPIRATORY_TRACT

## 2021-09-29 MED ORDER — AEROCHAMBER PLUS FLO-VU MEDIUM MISC
1.0000 | Freq: Once | Status: AC
  Administered 2021-09-29: 1

## 2021-09-29 MED ORDER — FLUTICASONE PROPIONATE HFA 44 MCG/ACT IN AERO: 2.0000 | INHALATION_SPRAY | Freq: Every day | RESPIRATORY_TRACT | 3 refills | Status: AC

## 2021-09-29 MED ORDER — PREDNISONE 10 MG (21) PO TBPK: ORAL_TABLET | Freq: Every day | ORAL | 0 refills | Status: DC

## 2021-09-29 MED ORDER — FLUTICASONE PROPIONATE 50 MCG/ACT NA SUSP: 1.0000 | Freq: Two times a day (BID) | NASAL | 2 refills | Status: AC

## 2021-09-29 MED ORDER — LORATADINE 10 MG PO TABS: 10.0000 mg | ORAL_TABLET | Freq: Every day | ORAL | 2 refills | Status: DC

## 2021-09-29 NOTE — ED Triage Notes (Signed)
Since November had cough that is productive, SOB, used inhaler last night and no relief. Reports had covid three times. Tried cough medications and [place] cough drops.

## 2021-09-29 NOTE — Discharge Instructions (Addendum)
Use the albuterol inhaler with the spacer 2 puffs every 4-6 hours as needed for wheezing or shortness of breath.  Use the fluticasone asthma inhaler with the spacer every day, twice a day, even if you are feeling well.  Use the fluticasone nasal spray 1 spray twice a day to help relieve your nasal allergies. Take the loratadine allergy pill every day.  Follow-up with your primary care provider soon as you are able.  You can use open scheduling on the [email or web address] website to help find an appointment with a new primary care provider.  She was primary care in the [facility] health website, and then choose either family medicine or internal medicine, choose new patient scheduling, and choose a location that is convenient to you, and make an appointment for as soon as possible.

## 2021-09-29 NOTE — ED Provider Notes (Signed)
[place]    CSN: [id number] Arrival date & time: 09/29/21  [id number]      History   Chief Complaint Chief Complaint  Patient presents with   Cough    HPI Donna Sanders is a 36 y.o. female.  She reports cough since November that is productive for clear sputum.  She also reports shortness of breath and wheezing.  She used her daughter's albuterol inhaler last night with no relief.  She takes Benadryl at night for nasal allergies.  She reports being very congested for the last couple of months.  Denies fever or chills.  Denies feeling ill.  Feels this is allergies and asthma.  Has had COVID 3 times in the past, last was a year and a half ago.  Current symptoms just started in November.   Cough Associated symptoms: rhinorrhea, shortness of breath and wheezing   Associated symptoms: no chills, no fever and no sore throat    Past Medical History:  Diagnosis Date   Asthma    Migraine    Obesity     Patient Active Problem List   Diagnosis Date Noted   Abscess 04/09/2013   Backache 04/09/2013   Excessive fetal growth affecting management of mother, antepartum 03/26/2013   Allergic rhinitis, seasonal 12/20/2012    Past Surgical History:  Procedure Laterality Date   ANKLE ARTHROPLASTY      OB History     Gravida  5   Para  4   Term  4   Preterm      AB      Living  4      SAB      IAB      Ectopic      Multiple      Live Births  4            Home Medications    Prior to Admission medications   Medication Sig Start Date End Date Taking? Authorizing Provider  fluticasone (FLONASE) 50 MCG/ACT nasal spray Place 1 spray into both nostrils 2 (two) times daily. 09/29/21  Yes Carvel Getting, NP  fluticasone (FLOVENT HFA) 44 MCG/ACT inhaler Inhale 2 puffs into the lungs daily. 09/29/21  Yes Carvel Getting, NP  loratadine (CLARITIN) 10 MG tablet Take 1 tablet (10 mg total) by mouth daily. 09/29/21  Yes Carvel Getting, NP  predniSONE (STERAPRED  UNI-PAK 21 TAB) 10 MG (21) TBPK tablet Take by mouth daily. Take 6 tabs by mouth daily  for 2 days, then 5 tabs for 2 days, then 4 tabs for 2 days, then 3 tabs for 2 days, 2 tabs for 2 days, then 1 tab by mouth daily for 2 days 09/29/21  Yes Nakeyia Menden, Dionne Bucy, NP  albuterol (PROVENTIL HFA;VENTOLIN HFA) 108 (90 BASE) MCG/ACT inhaler Inhale 2 puffs into the lungs every 6 (six) hours as needed for wheezing.  Patient not taking: Reported on 04/22/2021    [provider]  clindamycin (CLEOCIN-T) 1 % external solution Apply topically 2 (two) times daily. 04/22/21   Shelly Bombard, MD  metroNIDAZOLE (FLAGYL) 500 MG tablet Take 1 tablet (500 mg total) by mouth 2 (two) times daily. 04/22/21   Shelly Bombard, MD  oxyCODONE-acetaminophen (PERCOCET) 5-325 MG tablet Take 1 tablet by mouth every 4 (four) hours as needed for up to 6 doses for severe pain. Patient not taking: Reported on 04/22/2021 08/07/20   Seabron Spates, CNM    Family History Family History  Problem Relation Age of Onset   Asthma Mother    Cancer Mother    Depression Mother    Migraines Mother     Social History Social History   Tobacco Use   Smoking status: Some Days    Packs/day: 0.25    Types: Cigarettes   Smokeless tobacco: Never   Tobacco comments:    1 pack every 3 days   Vaping Use   Vaping Use: Never used  Substance Use Topics   Alcohol use: Not Currently   Drug use: No     Allergies   Penicillins   Review of Systems Review of Systems  Constitutional:  Negative for chills and fever.  HENT:  Positive for congestion, postnasal drip and rhinorrhea. Negative for sore throat.   Respiratory:  Positive for cough, shortness of breath and wheezing.     Physical Exam Triage Vital Signs ED Triage Vitals  Enc Vitals Group     BP 09/29/21 0955 139/68     Pulse Rate 09/29/21 0955 79     Resp 09/29/21 0955 20     Temp 09/29/21 0955 98.2 F (36.8 C)     Temp Source 09/29/21 0955 Oral     SpO2 09/29/21 0955  97 %     Weight --      Height --      Head Circumference --      Peak Flow --      Pain Score 09/29/21 0953 10     Pain Loc --      Pain Edu? --      Excl. in [place]? --    No data found.  Updated Vital Signs BP 139/68 (BP Location: Left Arm)    Pulse 79    Temp 98.2 F (36.8 C) (Oral)    Resp 20    LMP 09/23/2021    SpO2 97%   Visual Acuity Right Eye Distance:   Left Eye Distance:   Bilateral Distance:    Right Eye Near:   Left Eye Near:    Bilateral Near:     Physical Exam Constitutional:      General: She is not in acute distress.    Appearance: Normal appearance. She is not ill-appearing.  HENT:     Right Ear: Tympanic membrane, ear canal and external ear normal.     Left Ear: Tympanic membrane, ear canal and external ear normal.     Nose: Congestion and rhinorrhea present.     Mouth/Throat:     Mouth: Mucous membranes are moist.     Pharynx: Oropharynx is clear.  Cardiovascular:     Rate and Rhythm: Normal rate and regular rhythm.  Pulmonary:     Effort: Pulmonary effort is normal.     Breath sounds: Examination of the right-upper field reveals wheezing. Examination of the left-upper field reveals wheezing. Examination of the right-middle field reveals wheezing. Examination of the left-middle field reveals wheezing. Examination of the right-lower field reveals wheezing. Examination of the left-lower field reveals wheezing. Wheezing present.  Neurological:     Mental Status: She is alert.     UC Treatments / Results  Labs (all labs ordered are listed, but only abnormal results are displayed) Labs Reviewed - No data to display  EKG   Radiology No results found.  Procedures Procedures (including critical care time)  Medications Ordered in UC Medications  albuterol (VENTOLIN HFA) 108 (90 Base) MCG/ACT inhaler 2 puff (2 puffs Inhalation Given 09/29/21 1034)  AeroChamber Plus  Flo-Vu Medium MISC 1 each (1 each Other Given 09/29/21 1035)    Initial Impression /  Assessment and Plan / UC Course  I have reviewed the triage vital signs and the nursing notes.  Pertinent labs & imaging results that were available during my care of the patient were reviewed by me and considered in my medical decision making (see chart for details).    Patient given albuterol inhaler and spacer to use here in urgent care.  Wheezing improved, however still has faint wheezing in bilateral lower lobes.  Patient reports she does feel better and her shortness of breath is better after using albuterol inhaler.  Patient does not have a PCP at this time.  We discussed the importance of her getting one and I shared with her the [facility] open scheduling system for new patient primary care scheduling.  Prescribed fluticasone HFA to use twice a day for asthma prevention.  Prescribed prednisone taper.  Prescribed Flonase nasal spray and Claritin.  We discussed air quality and using HEPA filters such as the corsi- [place]enthal box to improve air quality in her home  Final Clinical Impressions(s) / UC Diagnoses   Final diagnoses:  Moderate asthma with exacerbation, unspecified whether persistent  Allergic rhinitis, unspecified seasonality, unspecified trigger     Discharge Instructions      Use the albuterol inhaler with the spacer 2 puffs every 4-6 hours as needed for wheezing or shortness of breath.  Use the fluticasone asthma inhaler with the spacer every day, twice a day, even if you are feeling well.  Use the fluticasone nasal spray 1 spray twice a day to help relieve your nasal allergies. Take the loratadine allergy pill every day.  Follow-up with your primary care provider soon as you are able.  You can use open scheduling on the [email or web address] website to help find an appointment with a new primary care provider.  She was primary care in the [facility] health website, and then choose either family medicine or internal medicine, choose new patient scheduling, and choose a location that is  convenient to you, and make an appointment for as soon as possible.    ED Prescriptions     Medication Sig Dispense Auth. Provider   fluticasone (FLONASE) 50 MCG/ACT nasal spray Place 1 spray into both nostrils 2 (two) times daily. 16 g Carvel Getting, NP   fluticasone (FLOVENT HFA) 44 MCG/ACT inhaler Inhale 2 puffs into the lungs daily. 1 each Carvel Getting, NP   predniSONE (STERAPRED UNI-PAK 21 TAB) 10 MG (21) TBPK tablet Take by mouth daily. Take 6 tabs by mouth daily  for 2 days, then 5 tabs for 2 days, then 4 tabs for 2 days, then 3 tabs for 2 days, 2 tabs for 2 days, then 1 tab by mouth daily for 2 days 42 tablet Towana Stenglein, Dionne Bucy, NP   loratadine (CLARITIN) 10 MG tablet Take 1 tablet (10 mg total) by mouth daily. 30 tablet Carvel Getting, NP      PDMP not reviewed this encounter.   Carvel Getting, NP 09/29/21 [phone]

## 2021-10-08 ENCOUNTER — Encounter: Payer: Self-pay | Admitting: Nurse Practitioner

## 2021-10-08 ENCOUNTER — Other Ambulatory Visit: Payer: Self-pay

## 2021-10-08 ENCOUNTER — Ambulatory Visit (INDEPENDENT_AMBULATORY_CARE_PROVIDER_SITE_OTHER): Payer: Medicaid Other | Admitting: Nurse Practitioner

## 2021-10-08 VITALS — BP 138/76 | HR 84 | Temp 97.9°F | Ht 63.0 in | Wt 270.0 lb

## 2021-10-08 DIAGNOSIS — Z3202 Encounter for pregnancy test, result negative: Secondary | ICD-10-CM | POA: Diagnosis not present

## 2021-10-08 DIAGNOSIS — J4540 Moderate persistent asthma, uncomplicated: Secondary | ICD-10-CM

## 2021-10-08 DIAGNOSIS — F17200 Nicotine dependence, unspecified, uncomplicated: Secondary | ICD-10-CM | POA: Diagnosis not present

## 2021-10-08 LAB — POCT URINE PREGNANCY: Preg Test, Ur: NEGATIVE

## 2021-10-08 MED ORDER — MONTELUKAST SODIUM 10 MG PO TABS: 10.0000 mg | ORAL_TABLET | Freq: Every day | ORAL | 3 refills | Status: AC

## 2021-10-08 NOTE — Patient Instructions (Signed)
Please sign a record release for GYN on check out.  ? ?

## 2021-10-08 NOTE — Progress Notes (Signed)
Careteam: Patient Care Team: Sharon Seller, NP as PCP - General (Geriatric Medicine)  PLACE OF SERVICE:  [facility]  Advanced Directive information    Allergies  Allergen Reactions   Penicillins Hives, Shortness Of Breath, Itching and Swelling    Throat swelling    Chief Complaint  Patient presents with   Establish Care    New patient to establish care. RX medications not present at initial appointment. Patient c/o shortness of breath since November of 2022. Patient recently seen at urgent care for asthma flare up. Patient would also like a pregnancy test and discuss getting a pap smear.      HPI: Patient is a 36 y.o. female to establish care. Pt with hx of obesity, migraine and asthma. Has been seen previously by GYN and no routine care with PCP  Asthma- has had for years taking Flovent twice daily- doing well with this.  Using albuterol 4 days a week. Using 2-3 times during the days. Uses it for wheezing or shortness of breath.  Hx of migraines- uses Excedrin occasionally. Report rare headache.   LMP was 09/23/21 and lasted 3 days which was not normal for her and would like pregnancy test.    Review of Systems:  Review of Systems  Constitutional:  Negative for chills, fever and weight loss.  HENT:  Negative for tinnitus.   Respiratory:  Positive for shortness of breath and wheezing. Negative for cough and sputum production.   Cardiovascular:  Negative for chest pain, palpitations and leg swelling.  Gastrointestinal:  Negative for abdominal pain, constipation, diarrhea and heartburn.  Genitourinary:  Negative for dysuria, frequency and urgency.  Musculoskeletal:  Negative for back pain, falls, joint pain and myalgias.  Skin: Negative.   Neurological:  Negative for dizziness and headaches.  Psychiatric/Behavioral:  Negative for depression and memory loss. The patient does not have insomnia.    Past Medical History:  Diagnosis Date   Asthma    Migraine     Obesity    Past Surgical History:  Procedure Laterality Date   ANKLE ARTHROPLASTY     Social History:   reports that she has been smoking cigarettes. She has been smoking an average of .25 packs per day. She has never used smokeless tobacco. She reports that she does not currently use alcohol. She reports that she does not use drugs.  Family History  Problem Relation Age of Onset   Asthma Mother    Cancer Mother    Depression Mother    Migraines Mother     Medications: Patient's Medications  New Prescriptions   No medications on file  Previous Medications   ALBUTEROL (PROVENTIL HFA;VENTOLIN HFA) 108 (90 BASE) MCG/ACT INHALER    Inhale 2 puffs into the lungs every 6 (six) hours as needed for wheezing.   FLUTICASONE (FLONASE) 50 MCG/ACT NASAL SPRAY    Place 1 spray into both nostrils 2 (two) times daily.   FLUTICASONE (FLOVENT HFA) 44 MCG/ACT INHALER    Inhale 2 puffs into the lungs daily.   LORATADINE (CLARITIN) 10 MG TABLET    Take 10 mg by mouth daily as needed for allergies.  Modified Medications   No medications on file  Discontinued Medications   CLINDAMYCIN (CLEOCIN-T) 1 % EXTERNAL SOLUTION    Apply topically 2 (two) times daily.   LORATADINE (CLARITIN) 10 MG TABLET    Take 1 tablet (10 mg total) by mouth daily.   METRONIDAZOLE (FLAGYL) 500 MG TABLET    Take  1 tablet (500 mg total) by mouth 2 (two) times daily.   OXYCODONE-ACETAMINOPHEN (PERCOCET) 5-325 MG TABLET    Take 1 tablet by mouth every 4 (four) hours as needed for up to 6 doses for severe pain.   PREDNISONE (STERAPRED UNI-PAK 21 TAB) 10 MG (21) TBPK TABLET    Take by mouth daily. Take 6 tabs by mouth daily  for 2 days, then 5 tabs for 2 days, then 4 tabs for 2 days, then 3 tabs for 2 days, 2 tabs for 2 days, then 1 tab by mouth daily for 2 days    Physical Exam:  Vitals:   10/08/21 1337  BP: 138/76  Pulse: 84  Temp: 97.9 F (36.6 C)  TempSrc: Temporal  SpO2: 97%  Weight: 270 lb (122.5 kg)  Height: $Remove'5\' 3"'mGMUpco$   (1.6 m)   Body mass index is 47.83 kg/m. Wt Readings from Last 3 Encounters:  10/08/21 270 lb (122.5 kg)  04/22/21 277 lb (125.6 kg)  08/14/20 272 lb 9.6 oz (123.7 kg)    Physical Exam Constitutional:      General: She is not in acute distress.    Appearance: She is well-developed. She is obese. She is not diaphoretic.  HENT:     Head: Normocephalic and atraumatic.     Mouth/Throat:     Pharynx: No oropharyngeal exudate.  Eyes:     Conjunctiva/sclera: Conjunctivae normal.     Pupils: Pupils are equal, round, and reactive to light.  Cardiovascular:     Rate and Rhythm: Normal rate and regular rhythm.     Heart sounds: Normal heart sounds.  Pulmonary:     Effort: Pulmonary effort is normal.     Breath sounds: Normal breath sounds.  Abdominal:     General: Bowel sounds are normal.     Palpations: Abdomen is soft.  Musculoskeletal:     Cervical back: Normal range of motion and neck supple.     Right lower leg: No edema.     Left lower leg: No edema.  Skin:    General: Skin is warm and dry.  Neurological:     Mental Status: She is alert and oriented to person, place, and time. Mental status is at baseline.  Psychiatric:        Mood and Affect: Mood normal.    Labs reviewed: Basic Metabolic Panel: No results for input(s): NA, K, CL, CO2, GLUCOSE, BUN, CREATININE, CALCIUM, MG, PHOS, TSH in the last 8760 hours. Liver Function Tests: No results for input(s): AST, ALT, ALKPHOS, BILITOT, PROT, ALBUMIN in the last 8760 hours. No results for input(s): LIPASE, AMYLASE in the last 8760 hours. No results for input(s): AMMONIA in the last 8760 hours. CBC: No results for input(s): WBC, NEUTROABS, HGB, HCT, MCV, PLT in the last 8760 hours. Lipid Panel: No results for input(s): CHOL, HDL, LDLCALC, TRIG, CHOLHDL, LDLDIRECT in the last 8760 hours. TSH: No results for input(s): TSH in the last 8760 hours. A1C: No results found for: HGBA1C   Assessment/Plan 1. Moderate persistent  asthma, unspecified whether complicated -smoking cessation encourage -to continue Flovent BID with albuterol PRN.  - will add montelukast (SINGULAIR) 10 MG tablet; Take 1 tablet (10 mg total) by mouth at bedtime.  Dispense: 30 tablet; Refill: 3 - CBC with Differential/Platelet; Future - CMP with eGFR([facility]); Future - Lipid panel; Future  2. Morbid obesity ([place]) --education provided on healthy weight loss through increase in physical activity and proper nutrition  - CBC with Differential/Platelet; Future -  CMP with eGFR([facility]); Future - Lipid panel; Future  3. Smoker Cessation encouraged - CBC with Differential/Platelet; Future - CMP with eGFR([facility]); Future - Lipid panel; Future  4. Pregnancy examination or test, negative result - POCT urine pregnancy- negative.  Requested test due to irregular menses.    Return in about 4 weeks (around 11/05/2021) for CPE with labs prior to visit . Not due for PAP until 04/2022 Carlos American. [place], [facility] [phone]

## 2021-10-13 ENCOUNTER — Other Ambulatory Visit: Payer: Medicaid Other

## 2021-10-13 DIAGNOSIS — F17200 Nicotine dependence, unspecified, uncomplicated: Secondary | ICD-10-CM

## 2021-10-13 DIAGNOSIS — J4540 Moderate persistent asthma, uncomplicated: Secondary | ICD-10-CM

## 2021-10-15 ENCOUNTER — Other Ambulatory Visit: Payer: Medicaid Other

## 2021-11-05 ENCOUNTER — Ambulatory Visit (INDEPENDENT_AMBULATORY_CARE_PROVIDER_SITE_OTHER): Payer: Medicaid Other | Admitting: Obstetrics

## 2021-11-05 ENCOUNTER — Other Ambulatory Visit: Payer: Self-pay

## 2021-11-05 ENCOUNTER — Other Ambulatory Visit (HOSPITAL_COMMUNITY)
Admission: RE | Admit: 2021-11-05 | Discharge: 2021-11-05 | Disposition: A | Discharge status: Home / Self Care | Payer: Medicaid Other | Source: Ambulatory Visit | Attending: Obstetrics | Admitting: Obstetrics

## 2021-11-05 ENCOUNTER — Encounter: Payer: Self-pay | Admitting: Obstetrics

## 2021-11-05 VITALS — BP 130/84 | HR 82 | Ht 62.0 in | Wt 272.0 lb

## 2021-11-05 DIAGNOSIS — Z6841 Body Mass Index (BMI) 40.0 and over, adult: Secondary | ICD-10-CM

## 2021-11-05 DIAGNOSIS — N393 Stress incontinence (female) (male): Secondary | ICD-10-CM

## 2021-11-05 DIAGNOSIS — N898 Other specified noninflammatory disorders of vagina: Secondary | ICD-10-CM | POA: Diagnosis not present

## 2021-11-05 DIAGNOSIS — Z01419 Encounter for gynecological examination (general) (routine) without abnormal findings: Secondary | ICD-10-CM | POA: Diagnosis present

## 2021-11-05 DIAGNOSIS — B851 Pediculosis due to Pediculus humanus corporis: Secondary | ICD-10-CM | POA: Insufficient documentation

## 2021-11-05 DIAGNOSIS — Z1501 Genetic susceptibility to malignant neoplasm of breast: Secondary | ICD-10-CM

## 2021-11-05 DIAGNOSIS — Z131 Encounter for screening for diabetes mellitus: Secondary | ICD-10-CM

## 2021-11-05 DIAGNOSIS — F17200 Nicotine dependence, unspecified, uncomplicated: Secondary | ICD-10-CM

## 2021-11-05 LAB — POCT URINALYSIS DIPSTICK
Bilirubin, UA: NEGATIVE
Blood, UA: NEGATIVE
Glucose, UA: NEGATIVE
Ketones, UA: NEGATIVE
Leukocytes, UA: NEGATIVE
Nitrite, UA: NEGATIVE
Protein, UA: NEGATIVE
Spec Grav, UA: 1.015 (ref 1.010–1.025)
Urobilinogen, UA: 0.2 E.U./dL
pH, UA: 5 (ref 5.0–8.0)

## 2021-11-05 LAB — POCT URINE PREGNANCY: Preg Test, Ur: NEGATIVE

## 2021-11-05 NOTE — Progress Notes (Signed)
GYN/ Annual ?Reports LMP "wasn't regular", didn't last as long, wants UPT ?Denies vaginal discharge, itching, or irritation. ?Reports "my urine is cloudy", denies dysuria. ?Reports "my bladder is weak", incontinence with cough/ sneeze ?

## 2021-11-05 NOTE — Progress Notes (Signed)
? ?Subjective: ? ? ?  ?  ? Donna Sanders is a 36 y.o. female here for a routine exam.  Current complaints: Vaginal discharge.  Leaking of urine with cough, sneeze, etc.   ? ?Personal health questionnaire:  ?Is patient Ashkenazi Jewish, have a family history of breast and/or ovarian cancer: no ?Is there a family history of uterine cancer diagnosed at age < 64, gastrointestinal cancer, urinary tract cancer, family member who is a Field seismologist syndrome-associated carrier: no ?Is the patient overweight and hypertensive, family history of diabetes, personal history of gestational diabetes, preeclampsia or PCOS: yes ?Is patient over 36, have PCOS,  family history of premature CHD under age 48, diabetes, smoke, have hypertension or peripheral artery disease:  no ?At any time, has a partner hit, kicked or otherwise hurt or frightened you?: no ?Over the past 2 weeks, have you felt down, depressed or hopeless?: no ?Over the past 2 weeks, have you felt little interest or pleasure in doing things?:no ? ? ?Gynecologic History ?Patient's last menstrual period was 10/24/2021. ?Contraception: none ?Last Pap: 2020. Results were: normal ?Last mammogram: n/a. Results were: n/a ? ?Obstetric History ?OB History  ?Gravida Para Term Preterm AB Living  ?5 4 4     4   ?SAB IAB Ectopic Multiple Live Births  ?        4  ?  ?# Outcome Date GA Lbr Len/2nd Weight Sex Delivery Anes PTL Lv  ?5 Gravida           ?4 Term 05/30/13 [redacted]w[redacted]d   F Vag-Spont None N LIV  ?   Birth Comments: System Generated. Please review and update pregnancy details.  ?3 Term 03/30/06 [redacted]w[redacted]d   F Vag-Spont EPI  LIV  ?2 Term 09/13/04 [redacted]w[redacted]d   M Vag-Spont None  LIV  ?1 Term 09/16/03 [redacted]w[redacted]d   F Vag-Spont None  LIV  ? ? ?Past Medical History:  ?Diagnosis Date  ? Asthma   ? Migraine   ? Obesity   ?  ?Past Surgical History:  ?Procedure Laterality Date  ? ANKLE ARTHROPLASTY    ?  ? ?Current Outpatient Medications:  ?  albuterol (PROVENTIL HFA;VENTOLIN HFA) 108 (90 BASE) MCG/ACT inhaler,  Inhale 2 puffs into the lungs every 6 (six) hours as needed for wheezing., Disp: , Rfl:  ?  fluticasone (FLONASE) 50 MCG/ACT nasal spray, Place 1 spray into both nostrils 2 (two) times daily., Disp: 16 g, Rfl: 2 ?  fluticasone (FLOVENT HFA) 44 MCG/ACT inhaler, Inhale 2 puffs into the lungs daily., Disp: 1 each, Rfl: 3 ?  montelukast (SINGULAIR) 10 MG tablet, Take 1 tablet (10 mg total) by mouth at bedtime., Disp: 30 tablet, Rfl: 3 ?Allergies  ?Allergen Reactions  ? Penicillins Hives, Shortness Of Breath, Itching and Swelling  ?  Throat swelling  ?  ?Social History  ? ?Tobacco Use  ? Smoking status: Every Day  ?  Packs/day: 0.25  ?  Types: Cigarettes  ? Smokeless tobacco: Never  ? Tobacco comments:  ?  2-3 cig daily   ?Substance Use Topics  ? Alcohol use: Not Currently  ?  ?Family History  ?Problem Relation Age of Onset  ? Asthma Mother   ? Cancer Mother   ? Depression Mother   ? Migraines Mother   ? Diabetes Father   ?  ? ? ?Review of Systems ? ?Constitutional: negative for fatigue and weight loss ?Respiratory: negative for cough and wheezing ?Cardiovascular: negative for chest pain, fatigue and palpitations ?Gastrointestinal: negative for  abdominal pain and change in bowel habits ?Musculoskeletal:negative for myalgias ?Neurological: negative for gait problems and tremors ?Behavioral/Psych: negative for abusive relationship, depression ?Endocrine: negative for temperature intolerance    ?Genitourinary: positive for vaginal discharge and leaking of urine with cough, sneeze, etc.  negative for abnormal menstrual periods, genital lesions, hot flashes, sexual problems  ?Integument/breast: negative for breast lump, breast tenderness, nipple discharge and skin lesion(s) ? ?  ?Objective:  ? ?    ?BP 130/84   Pulse 82   Ht 5\' 2"  (1.575 m)   Wt 272 lb (123.4 kg)   LMP 10/24/2021 Comment: states "it wasn't regular"  BMI 49.75 kg/m?  ?General:   Alert and no distress  ?Skin:   no rash or abnormalities  ?Lungs:   clear to  auscultation bilaterally  ?Heart:   regular rate and rhythm, S1, S2 normal, no murmur, click, rub or gallop  ?Breasts:   normal without suspicious masses, skin or nipple changes or axillary nodes  ?Abdomen:  normal findings: no organomegaly, soft, non-tender and no hernia  ?Pelvis:  External genitalia: normal general appearance ?Urinary system: urethral meatus normal and bladder without fullness, nontender ?Vaginal: normal without tenderness, induration or masses ?Cervix: normal appearance ?Adnexa: normal bimanual exam ?Uterus: anteverted and non-tender, normal size  ? ?Lab Review ?Urine pregnancy test ?Labs reviewed yes ?Radiologic studies reviewed no ? ?I have spent a total of 20 minutes of face-to-face time, excluding clinical staff time, reviewing notes and preparing to see patient, ordering tests and/or medications, and counseling the patient.  ? ?Assessment:  ? ?  1. Encounter for routine gynecological examination with Papanicolaou smear of cervix ?Rx: ?- Cytology - PAP ?- POCT urine pregnancy ?- TSH ?- CBC ?- POCT Urinalysis Dipstick ? ?2. Vaginal discharge ?Rx: ?- Cervicovaginal ancillary only( [place]) ? ?3. SUI (stress urinary incontinence, female) ?Rx: ?- Ambulatory referral to Urogynecology ? ?4. Class 3 severe obesity due to excess calories without serious comorbidity with body mass index (BMI) of 50.0 to 59.9 in adult [facility]) ?- weight reduction with the aid of dietary changes, exercise and behavioral modification recommended ? ?5. Breast cancer genetic susceptibility ?Rx: ?- MM Digital Screening; Future ?- BRCAssure Comprehensive Panel ? ?6. Screening for diabetes mellitus ?Rx: ?- Hemoglobin A1c ? ?7. Tobacco dependence ?- cessation recommended ? ?  ?Plan:  ? ? Education reviewed: calcium supplements, depression evaluation, low fat, low cholesterol diet, safe sex/STD prevention, self breast exams, smoking cessation, and weight bearing exercise. ?Contraception: none. ?Follow up in: 1 year.  ? ?No  orders of the defined types were placed in this encounter. ? ?Orders Placed This Encounter  ?Procedures  ? MM Digital Screening  ?  Standing Status:   Future  ?  Standing Expiration Date:   11/06/2022  ?  Order Specific Question:   Reason for Exam (SYMPTOM  OR DIAGNOSIS REQUIRED)  ?  Answer:   Breast cancer genetic susceptibility`  ?  Order Specific Question:   Is the patient pregnant?  ?  Answer:   No  ?  Order Specific Question:   Preferred imaging location?  ?  Answer:   [facility]  ? BRCAssure Comprehensive Panel  ?  Standing Status:   Future  ?  Standing Expiration Date:   11/06/2022  ? Hemoglobin A1c  ? TSH  ? CBC  ? POCT urine pregnancy  ? ? ? Shelly Bombard, MD ?11/05/2021 9:03 AM  ?

## 2021-11-06 LAB — CBC
Hematocrit: 35.6 % (ref 34.0–46.6)
Hemoglobin: 10.3 g/dL — ABNORMAL LOW (ref 11.1–15.9)
MCH: 18.5 pg — ABNORMAL LOW (ref 26.6–33.0)
MCHC: 28.9 g/dL — ABNORMAL LOW (ref 31.5–35.7)
MCV: 64 fL — ABNORMAL LOW (ref 79–97)
Platelets: 434 10*3/uL (ref 150–450)
RBC: 5.56 x10E6/uL — ABNORMAL HIGH (ref 3.77–5.28)
RDW: 21.9 % — ABNORMAL HIGH (ref 11.7–15.4)
WBC: 9.8 10*3/uL (ref 3.4–10.8)

## 2021-11-06 LAB — HEMOGLOBIN A1C
Est. average glucose Bld gHb Est-mCnc: 140 mg/dL
Hgb A1c MFr Bld: 6.5 % — ABNORMAL HIGH (ref 4.8–5.6)

## 2021-11-06 LAB — TSH: TSH: 4.95 u[IU]/mL — ABNORMAL HIGH (ref 0.450–4.500)

## 2021-11-08 ENCOUNTER — Telehealth: Payer: Self-pay

## 2021-11-08 ENCOUNTER — Other Ambulatory Visit: Payer: Self-pay | Admitting: Obstetrics

## 2021-11-08 DIAGNOSIS — B9689 Other specified bacterial agents as the cause of diseases classified elsewhere: Secondary | ICD-10-CM

## 2021-11-08 LAB — CERVICOVAGINAL ANCILLARY ONLY
Bacterial Vaginitis (gardnerella): POSITIVE — AB
Candida Glabrata: NEGATIVE
Candida Vaginitis: NEGATIVE
Chlamydia: NEGATIVE
Comment: NEGATIVE
Comment: NEGATIVE
Comment: NEGATIVE
Comment: NEGATIVE
Comment: NEGATIVE
Comment: NORMAL
Neisseria Gonorrhea: NEGATIVE
Trichomonas: NEGATIVE

## 2021-11-08 MED ORDER — METRONIDAZOLE 500 MG PO TABS: 500.0000 mg | ORAL_TABLET | Freq: Two times a day (BID) | ORAL | 2 refills | Status: DC

## 2021-11-08 NOTE — Telephone Encounter (Signed)
Attempted to contact about results and rx, no answer, vm not set up 

## 2021-11-09 LAB — CYTOLOGY - PAP
Comment: NEGATIVE
Diagnosis: UNDETERMINED — AB
High risk HPV: NEGATIVE

## 2021-11-10 ENCOUNTER — Telehealth: Payer: Self-pay

## 2021-11-10 NOTE — Telephone Encounter (Signed)
S/w pt and advised of results 

## 2021-11-11 NOTE — Progress Notes (Deleted)
? ? ?  Careteam: ?Patient Care Team: ?Lauree Chandler, NP as PCP - General (Geriatric Medicine) ? ?PLACE OF SERVICE:  ?[facility]  ?Advanced Directive information ?  ? ?Allergies  ?Allergen Reactions  ? Penicillins Hives, Shortness Of Breath, Itching and Swelling  ?  Throat swelling  ? ? ?No chief complaint on file. ? ? ? ?HPI: Patient is a 36 y.o. female *** ? ?Review of Systems:  ?ROS*** ? ?Past Medical History:  ?Diagnosis Date  ? Asthma   ? Migraine   ? Obesity   ? ?Past Surgical History:  ?Procedure Laterality Date  ? ANKLE ARTHROPLASTY    ? ?Social History: ?  reports that she has been smoking cigarettes. She has been smoking an average of .25 packs per day. She has never used smokeless tobacco. She reports that she does not currently use alcohol. She reports that she does not use drugs. ? ?Family History  ?Problem Relation Age of Onset  ? Asthma Mother   ? Cancer Mother   ? Depression Mother   ? Migraines Mother   ? Diabetes Father   ? ? ?Medications: ?Patient's Medications  ?New Prescriptions  ? No medications on file  ?Previous Medications  ? ALBUTEROL (PROVENTIL HFA;VENTOLIN HFA) 108 (90 BASE) MCG/ACT INHALER    Inhale 2 puffs into the lungs every 6 (six) hours as needed for wheezing.  ? FLUTICASONE (FLONASE) 50 MCG/ACT NASAL SPRAY    Place 1 spray into both nostrils 2 (two) times daily.  ? FLUTICASONE (FLOVENT HFA) 44 MCG/ACT INHALER    Inhale 2 puffs into the lungs daily.  ? METRONIDAZOLE (FLAGYL) 500 MG TABLET    Take 1 tablet (500 mg total) by mouth 2 (two) times daily.  ? MONTELUKAST (SINGULAIR) 10 MG TABLET    Take 1 tablet (10 mg total) by mouth at bedtime.  ?Modified Medications  ? No medications on file  ?Discontinued Medications  ? No medications on file  ? ? ?Physical Exam: ? ?There were no vitals filed for this visit. ?There is no height or weight on file to calculate BMI. ?Wt Readings from Last 3 Encounters:  ?11/05/21 272 lb (123.4 kg)  ?10/08/21 270 lb (122.5 kg)  ?04/22/21 277 lb (125.6  kg)  ? ? ?Physical Exam*** ? ?Labs reviewed: ?Basic Metabolic Panel: ?Recent Labs  ?  11/05/21 ?[id number]  ?TSH 4.950*  ? ?Liver Function Tests: ?No results for input(s): AST, ALT, ALKPHOS, BILITOT, PROT, ALBUMIN in the last 8760 hours. ?No results for input(s): LIPASE, AMYLASE in the last 8760 hours. ?No results for input(s): AMMONIA in the last 8760 hours. ?CBC: ?Recent Labs  ?  11/05/21 ?0958  ?WBC 9.8  ?HGB 10.3*  ?HCT 35.6  ?MCV 64*  ?PLT 434  ? ?Lipid Panel: ?No results for input(s): CHOL, HDL, LDLCALC, TRIG, CHOLHDL, LDLDIRECT in the last 8760 hours. ?TSH: ?Recent Labs  ?  11/05/21 ?0958  ?TSH 4.950*  ? ?A1C: ?Lab Results  ?Component Value Date  ? HGBA1C 6.5 (H) 11/05/2021  ? ? ? ?Assessment/Plan ?1. Morbid obesity ([place]) ?*** ? ?2. Type 2 diabetes mellitus with other specified complication, without long-term current use of insulin ([place]) ?*** ? ?3. Iron deficiency anemia, unspecified iron deficiency anemia type ?*** ? ? ?No follow-ups on file.: *** ?Carlos American. Dewaine Oats, AGNP ? ?[facility] ?[phone]  ?

## 2021-11-12 ENCOUNTER — Encounter: Payer: Self-pay | Admitting: Nurse Practitioner

## 2021-11-12 ENCOUNTER — Encounter: Payer: Medicaid Other | Admitting: Nurse Practitioner

## 2021-11-12 DIAGNOSIS — D509 Iron deficiency anemia, unspecified: Secondary | ICD-10-CM

## 2021-11-12 DIAGNOSIS — E1169 Type 2 diabetes mellitus with other specified complication: Secondary | ICD-10-CM

## 2021-11-12 NOTE — Progress Notes (Deleted)
? ? ?Careteam: ?Patient Care Team: ?Lauree Chandler, NP as PCP - General (Geriatric Medicine) ? ?PLACE OF SERVICE:  ?[facility]  ?Advanced Directive information ?Does Patient Have a Medical Advance Directive?: No, Would patient like information on creating a medical advance directive?: No - Patient declined ? ?Allergies  ?Allergen Reactions  ? Penicillins Hives, Shortness Of Breath, Itching and Swelling  ?  Throat swelling  ? ? ?Chief Complaint  ?Patient presents with  ? Annual Exam  ?  Yearly physical. Discuss need for covid boosters, td/tdap, flu vaccine and urine microalbumin or post pone if patient refuses.   ? ? ? ?HPI: Patient is a 36 y.o. female here for a yearly physical  ? ? ?Review of Systems:  ?ROS*** ? ?Past Medical History:  ?Diagnosis Date  ? Asthma   ? Migraine   ? Obesity   ? ?Past Surgical History:  ?Procedure Laterality Date  ? ANKLE ARTHROPLASTY    ? ?Social History: ?  reports that she has been smoking cigarettes. She has been smoking an average of .25 packs per day. She has never used smokeless tobacco. She reports that she does not currently use alcohol. She reports that she does not use drugs. ? ?Family History  ?Problem Relation Age of Onset  ? Asthma Mother   ? Cancer Mother   ? Depression Mother   ? Migraines Mother   ? Diabetes Father   ? ? ?Medications: ?Patient's Medications  ?New Prescriptions  ? No medications on file  ?Previous Medications  ? ALBUTEROL (PROVENTIL HFA;VENTOLIN HFA) 108 (90 BASE) MCG/ACT INHALER    Inhale 2 puffs into the lungs every 6 (six) hours as needed for wheezing.  ? FLUTICASONE (FLONASE) 50 MCG/ACT NASAL SPRAY    Place 1 spray into both nostrils 2 (two) times daily.  ? FLUTICASONE (FLOVENT HFA) 44 MCG/ACT INHALER    Inhale 2 puffs into the lungs daily.  ? METRONIDAZOLE (FLAGYL) 500 MG TABLET    Take 1 tablet (500 mg total) by mouth 2 (two) times daily.  ? MONTELUKAST (SINGULAIR) 10 MG TABLET    Take 1 tablet (10 mg total) by mouth at bedtime.  ?Modified  Medications  ? No medications on file  ?Discontinued Medications  ? No medications on file  ? ? ?Physical Exam: ? ?Vitals:  ? 11/12/21 0941  ?Height: 5\' 2"  (1.575 m)  ? ?Body mass index is 49.75 kg/m?. ?Wt Readings from Last 3 Encounters:  ?11/05/21 272 lb (123.4 kg)  ?10/08/21 270 lb (122.5 kg)  ?04/22/21 277 lb (125.6 kg)  ? ? ?Physical Exam*** ? ?Labs reviewed: ?Basic Metabolic Panel: ?Recent Labs  ?  11/05/21 ?[id number]  ?TSH 4.950*  ? ?Liver Function Tests: ?No results for input(s): AST, ALT, ALKPHOS, BILITOT, PROT, ALBUMIN in the last 8760 hours. ?No results for input(s): LIPASE, AMYLASE in the last 8760 hours. ?No results for input(s): AMMONIA in the last 8760 hours. ?CBC: ?Recent Labs  ?  11/05/21 ?0958  ?WBC 9.8  ?HGB 10.3*  ?HCT 35.6  ?MCV 64*  ?PLT 434  ? ?Lipid Panel: ?No results for input(s): CHOL, HDL, LDLCALC, TRIG, CHOLHDL, LDLDIRECT in the last 8760 hours. ?TSH: ?Recent Labs  ?  11/05/21 ?0958  ?TSH 4.950*  ? ?A1C: ?Lab Results  ?Component Value Date  ? HGBA1C 6.5 (H) 11/05/2021  ? ? ? ?Assessment/Plan ?1. Morbid obesity ([place]) ?*** ? ?2. Type 2 diabetes mellitus with other specified complication, without long-term current use of insulin ([place]) ?*** ? ?3. Iron  deficiency anemia, unspecified iron deficiency anemia type ?*** ? ? ?Next appt: No follow-ups on file. ? ?Carlos American. Dewaine Oats, AGNP ? ?[facility] ?[phone]  ? ? ? ? ?

## 2021-11-15 NOTE — Progress Notes (Signed)
err

## 2021-11-18 LAB — BRCASSURE COMPREHENSIVE PANEL

## 2021-11-24 ENCOUNTER — Telehealth: Payer: Self-pay

## 2021-11-24 NOTE — Telephone Encounter (Signed)
error 

## 2022-05-27 ENCOUNTER — Inpatient Hospital Stay: Payer: Medicaid Other | Admitting: Nurse Practitioner

## 2023-05-25 ENCOUNTER — Ambulatory Visit: Payer: Medicaid Other | Admitting: Obstetrics and Gynecology

## 2023-06-25 ENCOUNTER — Emergency Department (HOSPITAL_BASED_OUTPATIENT_CLINIC_OR_DEPARTMENT_OTHER)
Admission: EM | Admit: 2023-06-25 | Discharge: 2023-06-25 | Disposition: A | Discharge status: Home / Self Care | Payer: Medicaid Other | Attending: Emergency Medicine | Admitting: Emergency Medicine

## 2023-06-25 ENCOUNTER — Other Ambulatory Visit: Payer: Self-pay

## 2023-06-25 ENCOUNTER — Encounter (HOSPITAL_BASED_OUTPATIENT_CLINIC_OR_DEPARTMENT_OTHER): Payer: Self-pay | Admitting: Emergency Medicine

## 2023-06-25 ENCOUNTER — Emergency Department (HOSPITAL_BASED_OUTPATIENT_CLINIC_OR_DEPARTMENT_OTHER): Payer: Medicaid Other

## 2023-06-25 DIAGNOSIS — J45909 Unspecified asthma, uncomplicated: Secondary | ICD-10-CM | POA: Diagnosis not present

## 2023-06-25 DIAGNOSIS — M79601 Pain in right arm: Secondary | ICD-10-CM | POA: Diagnosis not present

## 2023-06-25 DIAGNOSIS — R200 Anesthesia of skin: Secondary | ICD-10-CM | POA: Diagnosis present

## 2023-06-25 DIAGNOSIS — Z7951 Long term (current) use of inhaled steroids: Secondary | ICD-10-CM | POA: Diagnosis not present

## 2023-06-25 DIAGNOSIS — R202 Paresthesia of skin: Secondary | ICD-10-CM | POA: Diagnosis not present

## 2023-06-25 HISTORY — DX: Carpal tunnel syndrome, bilateral upper limbs: G56.03

## 2023-06-25 MED ORDER — GABAPENTIN 100 MG PO CAPS: 100.0000 mg | ORAL_CAPSULE | Freq: Three times a day (TID) | ORAL | 0 refills | Status: AC

## 2023-06-25 NOTE — ED Triage Notes (Signed)
Right hand (middle finger area)  numbness intermittently for 2 weeks.  Constant since yesterday.  No known injury.  Some soreness in right arm and shoulder.    Good radial pulse, skin warm and dry, color good.

## 2023-06-25 NOTE — ED Provider Notes (Signed)
[place] [facility] Provider Note   CSN: [id number] Arrival date & time: 06/25/23  1119     History  Chief Complaint  Patient presents with   Numbness    Donna Sanders is a 37 y.o. female.  The history is provided by the patient and medical records. No language interpreter was used.     37 year old female history of obesity, carpal tunnel syndrome, asthma, presenting complaining of numbness to the hand.  Patient states for the past 2 weeks she has noticed progressive worsening tingling and numbness sensation to her right hand especially her middle finger with pain to the palm of her hand extending towards her forearm.  Initially it was waxing waning and now becoming more persistent.  She denies any arm weakness, dropping objects, having vision changes having neck pain or shoulder pain.  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  She mention having, tunnel syndrome many years in the past.  No history of MS.  No recent trauma.  Home Medications Prior to Admission medications   Medication Sig Start Date End Date Taking? Authorizing Provider  albuterol (PROVENTIL HFA;VENTOLIN HFA) 108 (90 BASE) MCG/ACT inhaler Inhale 2 puffs into the lungs every 6 (six) hours as needed for wheezing.    [provider]  fluticasone (FLONASE) 50 MCG/ACT nasal spray Place 1 spray into both nostrils 2 (two) times daily. 09/29/21   Cathlyn Parsons, NP  fluticasone (FLOVENT HFA) 44 MCG/ACT inhaler Inhale 2 puffs into the lungs daily. 09/29/21   Cathlyn Parsons, NP  metroNIDAZOLE (FLAGYL) 500 MG tablet Take 1 tablet (500 mg total) by mouth 2 (two) times daily. 11/08/21   Brock Bad, MD  montelukast (SINGULAIR) 10 MG tablet Take 1 tablet (10 mg total) by mouth at bedtime. 10/08/21   Sharon Seller, NP      Allergies    Penicillins    Review of Systems   Review of Systems  All other systems reviewed and are negative.   Physical Exam Updated Vital  Signs BP (!) 156/84 (BP Location: Left Arm)   Pulse 74   Temp 98.1 F (36.7 C) (Oral)   Resp 16   Ht 5\' 2"  (1.575 m)   Wt 117.9 kg   LMP 06/18/2023   SpO2 94%   BMI 47.55 kg/m  Physical Exam Vitals and nursing note reviewed.  Constitutional:      General: She is not in acute distress.    Appearance: She is well-developed. She is obese.  HENT:     Head: Atraumatic.  Eyes:     Conjunctiva/sclera: Conjunctivae normal.  Pulmonary:     Effort: Pulmonary effort is normal.  Musculoskeletal:        General: Tenderness (Right arm: Subjective decrease sensation about the right middle finger with full range of motion normal strength normal grip strength no overlying skin changes no deformity radial pulse 2+ forearm compartment soft) present.     Cervical back: Neck supple.  Skin:    Findings: No rash.  Neurological:     Mental Status: She is alert.  Psychiatric:        Mood and Affect: Mood normal.     ED Results / Procedures / Treatments   Labs (all labs ordered are listed, but only abnormal results are displayed) Labs Reviewed - No data to display  EKG None  Radiology DG Shoulder Right  Result Date: 06/25/2023 CLINICAL DATA:  Right shoulder soreness. EXAM: RIGHT  SHOULDER - 2+ VIEW COMPARISON:  None Available. FINDINGS: No evidence for an acute fracture. No shoulder separation or dislocation. No worrisome lytic or sclerotic osseous abnormality. IMPRESSION: Negative. Electronically Signed   By: Kennith Center M.D.   On: 06/25/2023 12:34    Procedures Procedures    Medications Ordered in ED Medications - No data to display  ED Course/ Medical Decision Making/ A&P                                 Medical Decision Making Amount and/or Complexity of Data Reviewed Radiology: ordered.   BP (!) 156/84 (BP Location: Left Arm)   Pulse 74   Temp 98.1 F (36.7 C) (Oral)   Resp 16   Ht 5\' 2"  (1.575 m)   Wt 117.9 kg   LMP 06/18/2023   SpO2 94%   BMI 47.55 kg/m   30:58  PM 37 year old female history of obesity, carpal tunnel syndrome, asthma, presenting complaining of numbness to the hand.  Patient states for the past 2 weeks she has noticed progressive worsening tingling and numbness sensation to her right hand especially her middle finger with pain to the palm of her hand extending towards her forearm.  Initially it was waxing waning and now becoming more persistent.  She denies any arm weakness, dropping objects, having vision changes having neck pain or shoulder pain.  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  She mention having, tunnel syndrome many years in the past.  No history of MS.  No recent trauma.  On exam this is an obese female resting comfortably appears to be in no acute discomfort.  She also has subjective decrease sensation about her right middle finger with full range of motion about the finger and hand normal grip strength no deformity no signs of infection in her form compartments soft.  Radial pulse 2+.  Objectively no concerning finding noted on my exam.  DDx: Neuropathy, carpal tunnel syndrome, MS, strain, sprain, radicular pain  X-ray about the right shoulder obtained independent viewed and interpreted by me and without any acute finding, agree with radiology interpretation.  Suspect her symptoms may be due to carpal tunnel syndrome, will apply wrist brace for support and will give patient outpatient follow-up with hand specialist as needed.  Low suspicion for MS or other central cause of her numbness.  Doubt compartment syndrome.  I gave patient return precaution.        Final Clinical Impression(s) / ED Diagnoses Final diagnoses:  Paresthesia    Rx / DC Orders ED Discharge Orders          Ordered    gabapentin (NEURONTIN) 100 MG capsule  3 times daily        06/25/23 1338              Fayrene Helper, PA-C 06/25/23 1425    Trifan, Kermit Balo, MD 06/25/23 1437

## 2023-06-25 NOTE — Discharge Instructions (Addendum)
Your pain and tingling sensation to your finger and hand may be due to carpal tunnel syndrome.  Please wear wrist brace when possible to help decrease pressure upon the nerve that controls your finger.  Please follow-up with hand specialist for outpatient evaluation of your condition.  Take gabapentin as needed for nerve pain.

## 2023-06-28 ENCOUNTER — Telehealth: Payer: Self-pay

## 2023-06-28 NOTE — Transitions of Care (Post Inpatient/ED Visit) (Signed)
   06/28/2023  Name: Donna Sanders MRN: [id number] DOB: September 01, 1985  Today's TOC FU Call Status: Today's TOC FU Call Status:: Unsuccessful Call (1st Attempt) Unsuccessful Call (1st Attempt) Date: 06/28/23  Attempted to reach the patient regarding the most recent Inpatient/ED visit. Called patient and no answer. Voicemail was left with office call back number.    Follow Up Plan: Additional outreach attempts will be made to reach the patient to complete the Transitions of Care (Post Inpatient/ED visit) call.   Signature : Cariann Kinnamon.D/RMA

## 2023-07-03 NOTE — Transitions of Care (Post Inpatient/ED Visit) (Unsigned)
   07/03/2023  Name: Donna Sanders MRN: [id number] DOB: November 11, 1985  Today's TOC FU Call Status: Today's TOC FU Call Status:: Unsuccessful Call (2nd Attempt) Unsuccessful Call (1st Attempt) Date: 06/28/23 Unsuccessful Call (2nd Attempt) Date: 07/03/23  Attempted to reach the patient regarding the most recent Inpatient/ED visit. Called patient and no answer. Voicemail was left with office call back number.    Follow Up Plan: Additional outreach attempts will be made to reach the patient to complete the Transitions of Care (Post Inpatient/ED visit) call.   Signature : Alexus Galka.D/RMA

## 2023-07-04 ENCOUNTER — Encounter (HOSPITAL_BASED_OUTPATIENT_CLINIC_OR_DEPARTMENT_OTHER): Payer: Self-pay | Admitting: Emergency Medicine

## 2023-07-04 ENCOUNTER — Emergency Department (HOSPITAL_BASED_OUTPATIENT_CLINIC_OR_DEPARTMENT_OTHER): Payer: Medicaid Other

## 2023-07-04 ENCOUNTER — Other Ambulatory Visit: Payer: Self-pay

## 2023-07-04 ENCOUNTER — Emergency Department (HOSPITAL_BASED_OUTPATIENT_CLINIC_OR_DEPARTMENT_OTHER)
Admission: EM | Admit: 2023-07-04 | Discharge: 2023-07-05 | Disposition: A | Discharge status: Home / Self Care | Payer: Medicaid Other | Attending: Emergency Medicine | Admitting: Emergency Medicine

## 2023-07-04 DIAGNOSIS — S93602A Unspecified sprain of left foot, initial encounter: Secondary | ICD-10-CM | POA: Diagnosis not present

## 2023-07-04 DIAGNOSIS — W228XXA Striking against or struck by other objects, initial encounter: Secondary | ICD-10-CM | POA: Insufficient documentation

## 2023-07-04 DIAGNOSIS — S99922A Unspecified injury of left foot, initial encounter: Secondary | ICD-10-CM | POA: Diagnosis present

## 2023-07-04 NOTE — ED Triage Notes (Signed)
Pt c/o LT foot pain (anterior) after hitting it on "something" today; reports swelling

## 2023-07-05 MED ORDER — IBUPROFEN 400 MG PO TABS
600.0000 mg | ORAL_TABLET | Freq: Once | ORAL | Status: AC
  Administered 2023-07-05: 600 mg via ORAL
  Filled 2023-07-05: qty 1

## 2023-07-05 NOTE — Transitions of Care (Post Inpatient/ED Visit) (Signed)
   07/05/2023  Name: Donna Sanders MRN: [id number] DOB: 10/06/1985  Today's TOC FU Call Status: Today's TOC FU Call Status:: Unsuccessful Call (3rd Attempt) Unsuccessful Call (1st Attempt) Date: 06/28/23 Unsuccessful Call (2nd Attempt) Date: 07/03/23 Unsuccessful Call (3rd Attempt) Date: 07/05/23  Attempted to reach the patient regarding the most recent Inpatient/ED visit. Called patient and no answer. Voicemail was left with office call back number.    Follow Up Plan: No further outreach attempts will be made at this time. We have been unable to contact the patient.  Signature : Lashante Fryberger.D/RMA

## 2023-07-05 NOTE — ED Provider Notes (Signed)
[place] [facility]  Provider Note  CSN: [id number] Arrival date & time: 07/04/23 2107  History Chief Complaint  Patient presents with   Foot Pain    Donna Sanders is a 37 y.o. female with no significant PMH reports 1 day of L foot pain, unsure if she might have bumped it on something. Did not try taking any medications.    Home Medications Prior to Admission medications   Medication Sig Start Date End Date Taking? Authorizing Provider  albuterol (PROVENTIL HFA;VENTOLIN HFA) 108 (90 BASE) MCG/ACT inhaler Inhale 2 puffs into the lungs every 6 (six) hours as needed for wheezing.    [provider]  fluticasone (FLONASE) 50 MCG/ACT nasal spray Place 1 spray into both nostrils 2 (two) times daily. 09/29/21   Cathlyn Parsons, NP  fluticasone (FLOVENT HFA) 44 MCG/ACT inhaler Inhale 2 puffs into the lungs daily. 09/29/21   Cathlyn Parsons, NP  gabapentin (NEURONTIN) 100 MG capsule Take 1 capsule (100 mg total) by mouth 3 (three) times daily. 06/25/23   Fayrene Helper, PA-C  metroNIDAZOLE (FLAGYL) 500 MG tablet Take 1 tablet (500 mg total) by mouth 2 (two) times daily. 11/08/21   Brock Bad, MD  montelukast (SINGULAIR) 10 MG tablet Take 1 tablet (10 mg total) by mouth at bedtime. 10/08/21   Sharon Seller, NP     Allergies    Penicillins   Review of Systems   Review of Systems Please see HPI for pertinent positives and negatives  Physical Exam BP (!) 147/85 (BP Location: Right Arm)   Pulse 82   Temp 97.7 F (36.5 C)   Resp 18   Ht 5\' 2"  (1.575 m)   Wt 122.5 kg   LMP 06/18/2023   SpO2 100%   BMI 49.38 kg/m   Physical Exam Vitals and nursing note reviewed.  HENT:     Head: Normocephalic.     Nose: Nose normal.  Eyes:     Extraocular Movements: Extraocular movements intact.  Pulmonary:     Effort: Pulmonary effort is normal.  Musculoskeletal:        General: Tenderness (dorsal L foot, no focal bony tenderness) present.  Normal range of motion.     Cervical back: Neck supple.  Skin:    Findings: No rash (on exposed skin).  Neurological:     Mental Status: She is alert and oriented to person, place, and time.  Psychiatric:        Mood and Affect: Mood normal.     ED Results / Procedures / Treatments   EKG None  Procedures Procedures  Medications Ordered in the ED Medications  ibuprofen (ADVIL) tablet 600 mg (has no administration in time range)    Initial Impression and Plan  Patient here with L foot pain, benign exam. I personally viewed the images from radiology studies and agree with radiologist interpretation: Xray is neg for fracture. Will provide ACE wrap, Motrin for pain and recommend ice, elevation and rest.   ED Course       MDM Rules/Calculators/A&P Medical Decision Making Problems Addressed: Sprain of left foot, initial encounter: acute illness or injury  Amount and/or Complexity of Data Reviewed Radiology: ordered and independent interpretation performed. Decision-making details documented in ED Course.  Risk OTC drugs.     Final Clinical Impression(s) / ED Diagnoses Final diagnoses:  Sprain of left foot, initial encounter    Rx / DC Orders ED Discharge Orders  None        Pollyann Savoy, MD 07/05/23 0130

## 2023-07-10 ENCOUNTER — Telehealth: Payer: Self-pay

## 2023-07-10 ENCOUNTER — Ambulatory Visit: Payer: Medicaid Other | Admitting: Family Medicine

## 2023-07-10 NOTE — Transitions of Care (Post Inpatient/ED Visit) (Signed)
   07/10/2023  Name: Donna Sanders MRN: [id number] DOB: 07/14/1986  Today's TOC FU Call Status: Today's TOC FU Call Status:: Unsuccessful Call (1st Attempt) Unsuccessful Call (1st Attempt) Date: 07/10/23  Attempted to reach the patient regarding the most recent Inpatient/ED visit. Called patient and no answer. Voicemail was left with office call back number.    Follow Up Plan: Additional outreach attempts will be made to reach the patient to complete the Transitions of Care (Post Inpatient/ED visit) call.   Signature: Cozette Braggs.D/RMA

## 2023-07-12 NOTE — Transitions of Care (Post Inpatient/ED Visit) (Signed)
   07/12/2023  Name: Donna Sanders MRN: [id number] DOB: [birth year]  Today's TOC FU Call Status: Today's TOC FU Call Status:: Unsuccessful Call (2nd Attempt) Unsuccessful Call (1st Attempt) Date: 07/10/23 Unsuccessful Call (2nd Attempt) Date: 07/12/23  Attempted to reach the patient regarding the most recent Inpatient/ED visit.  Follow Up Plan: Additional outreach attempts will be made to reach the patient to complete the Transitions of Care (Post Inpatient/ED visit) call.   Signature: Leen Tworek.D/RMA

## 2023-07-25 NOTE — Transitions of Care (Post Inpatient/ED Visit) (Signed)
   07/25/2023  Name: Donna Sanders MRN: [id number] DOB: 11-14-1985  Today's TOC FU Call Status: Today's TOC FU Call Status:: Unsuccessful Call (3rd Attempt) Unsuccessful Call (1st Attempt) Date: 07/10/23 Unsuccessful Call (2nd Attempt) Date: 07/12/23 Unsuccessful Call (3rd Attempt) Date: 07/25/23  Attempted to reach the patient regarding the most recent Inpatient/ED visit.  Follow Up Plan: No further outreach attempts will be made at this time. We have been unable to contact the patient.  Signature : Andrae Claunch.D/RMA

## 2023-10-20 NOTE — Progress Notes (Signed)
 ANNUAL EXAM Patient name: Donna Sanders MRN [id number]  Date of birth: [birth year] Chief Complaint:   No chief complaint on file.  History of Present Illness:   Donna Sanders is a 38 y.o. [phone] female being seen today for a routine annual exam.   Current complaints: Abnormal cloudy white vaginal discharge x 1 wk, mild itching, has occurred before and needed treatment. Patient denies n/v, pelvic pain, burning, irritation, malodor, urinary frequency/urgency.   No LMP recorded.  Last pap 11/05/21 ASCUS HPV neg, 05/02/19 NILM HPV neg  Last mammogram: 06/2023 d/t family history of breast cancer (mother dx at age 34)- normal result.  Last colonoscopy: Not yet indicated due to age.  Family h/o colorectal cancer: no STI testing: Accepts swabs Contraception: None Social: down to 1.5 cigarettes daily from 4 daily. Not interested in pharmacotherapy at this time.      11/05/2021    8:27 AM 10/08/2021    1:34 PM  Depression screen PHQ 2/9  Decreased Interest 0 0  Down, Depressed, Hopeless 0 0  PHQ - 2 Score 0 0  Altered sleeping 3   Tired, decreased energy 1   Change in appetite 0   Feeling bad or failure about yourself  0   Trouble concentrating 0   Moving slowly or fidgety/restless 0   Suicidal thoughts 0   PHQ-9 Score 4         11/05/2021    8:29 AM  GAD 7 : Generalized Anxiety Score  Nervous, Anxious, on Edge 1  Control/stop worrying 1  Worry too much - different things 1  Trouble relaxing 0  Restless 0  Easily annoyed or irritable 1  Afraid - awful might happen 0  Total GAD 7 Score 4     Review of Systems:   Pertinent items are noted in HPI Denies any headaches, blurred vision, fatigue, shortness of breath, chest pain, abdominal pain, abnormal vaginal discharge/itching/odor/irritation, problems with periods, bowel movements, urination, or intercourse unless otherwise stated above. Pertinent History Reviewed:  Reviewed past medical,surgical, social and family history.   Reviewed problem list, medications and allergies. Physical Assessment:  There were no vitals filed for this visit.There is no height or weight on file to calculate BMI.        Physical Examination:   General appearance - well appearing, and in no distress  Mental status - alert, oriented to person, place, and time  Psych:  She has a normal mood and affect  Skin - warm and dry, normal color, no suspicious lesions noted  Chest - effort normal, all lung fields clear to auscultation bilaterally  Heart - normal rate and regular rhythm  Neck:  midline trachea, no thyromegaly or nodules  Breasts - breasts appear normal, no suspicious masses, no skin or nipple changes or  axillary nodes  Abdomen - soft, nontender, nondistended, no masses or organomegaly  Pelvic - VULVA: normal appearing vulva with no masses, tenderness or lesions  VAGINA: normal appearing vagina with normal color and discharge, no lesions    Extremities:  No swelling or varicosities noted  Chaperone present for exam  No results found for this or any previous visit (from the past 24 hours).  Assessment & Plan:  1. Encounter for annual routine gynecological examination (Primary) - Cervical cancer screening: Discussed guidelines. Pap with HPV repeat Mar 2026.  - GC/CT: accepts - Birth Control: Patient politely declines for now - Breast Health: Encouraged self breast awareness/SBE. Teaching provided.  - Mammogram: ordered for  06/2024, or sooner if problems. Will consider genetic testing given family hx.  - Colonoscopy: @ 38yo, or sooner if problems - F/U 12 months and prn  - MM 3D SCREENING MAMMOGRAM BILATERAL BREAST; Future  2. Vaginal discharge - Cervicovaginal ancillary only   No orders of the defined types were placed in this encounter.   Meds: No orders of the defined types were placed in this encounter.  Follow-up: No follow-ups on file.  Ralene Muskrat, [place] 10/20/2023 6:44 PM

## 2023-10-23 ENCOUNTER — Other Ambulatory Visit (HOSPITAL_COMMUNITY)
Admission: RE | Admit: 2023-10-23 | Discharge: 2023-10-23 | Disposition: A | Discharge status: Home / Self Care | Source: Ambulatory Visit | Attending: Physician Assistant | Admitting: Physician Assistant

## 2023-10-23 ENCOUNTER — Ambulatory Visit (INDEPENDENT_AMBULATORY_CARE_PROVIDER_SITE_OTHER): Payer: Medicaid Other | Admitting: Physician Assistant

## 2023-10-23 ENCOUNTER — Encounter: Payer: Self-pay | Admitting: Physician Assistant

## 2023-10-23 VITALS — BP 127/79 | HR 75 | Ht 62.0 in | Wt 284.0 lb

## 2023-10-23 DIAGNOSIS — N898 Other specified noninflammatory disorders of vagina: Secondary | ICD-10-CM | POA: Insufficient documentation

## 2023-10-23 DIAGNOSIS — Z01419 Encounter for gynecological examination (general) (routine) without abnormal findings: Secondary | ICD-10-CM

## 2023-10-23 DIAGNOSIS — Z124 Encounter for screening for malignant neoplasm of cervix: Secondary | ICD-10-CM

## 2023-10-23 NOTE — Patient Instructions (Signed)
 What is constipation?  This is a common problem that makes it hard to have bowel movements. Your bowel movements might be:  ?Too hard  ?Too small  ?Hard to get out  ?Happening fewer than 3 times a week   What causes constipation?  It can be caused by:  ?Side effects of some medicines  ?Poor diet  ?Diseases of the digestive system (figure 1)   What other symptoms should I watch for?  These symptoms could be signs of a more serious problem:  ?Blood in the toilet or on the toilet paper after having a bowel movement  ?Fever  ?Weight loss  ?Feeling weak  It could also be a sign of a problem if you have new constipation without a change in your medicines or diet, and have never had constipation in the past.   Will I need tests?  Your doctor or nurse will decide if you need tests based on your age, other symptoms, and individual situation. There are lots of tests, but you might not need any.  Here are the most common tests doctors use to find the cause of constipation:  ?Rectal exam - Your doctor look at the outside of your anus. They also use a finger to feel inside the opening.  ?Sigmoidoscopy or colonoscopy - The doctor puts a thin tube into your anus. Then, they advance the tube into your large intestine. The large intestine is also called the colon. The tube has a camera attached to it, so the doctor can look inside your intestines. During these tests, the doctor can also take samples of tissue to look at under a microscope (figure 2).  ?X-rays, CT scan, or MRI - These create images of the inside of your body.  ?Manometry studies - This lets the doctor measure the pressure inside your rectum at various points. It can help them find out if your muscles that control bowel movements are working right. The test also shows whether your rectum can feel normally.   What can I do on my own?  You can try the following:  ?Eat foods that have a lot of fiber. Good choices  are fruits, vegetables, prune juice, and cereal (table 1).  ?Drink plenty of water and other fluids.  ?When you feel the need to have a bowel movement, go to the bathroom. Don't hold it.  ?Try having a bowel movement first thing in the morning or soon after a meal.  ?When you are trying to have a bowel movement, certain positions might help. Try leaning forward slightly and using a stool or foot rest under your feet.  ?Take laxatives. These are medicines that help make bowel movements easier to get out. Some are pills you swallow. Others go into your rectum.  ?Get regular physical activity. Some people find that this helps.   How is constipation treated?  It depends on what is causing your constipation. First, your doctor will ask you to try eating more fiber and drinking more water. If this and the above tips do not help, they might suggest:  ?Medicines you swallow or put in your rectum  ?Changing the medicines you take for other conditions  ?Using "enemas" - Enemas can be just water, or they can contain medicine to help with constipation.  ?Biofeedback - This is a technique that teaches you to relax your muscles so you can let go and push bowel movements out.   How can I help prevent constipation?  You can reduce your  chances of getting constipation again if you:  ?Eat a high-fiber diet (table 1).  ?Drink water and other fluids during the day. This helps keep your bowel movements soft.  ?Set a regular schedule to try and have a bowel movement. Do not ignore the urge to go.   When should I call the doctor?  Call your doctor or nurse for advice if:  ?Your symptoms are new or not normal for you.  ?You do not have a bowel movement for a few days.  ?The constipation comes and goes, but lasts longer than 3 weeks.  ?You are in a lot of pain.  ?You have other symptoms that also worry you, such as bleeding, weakness, weight loss, or fever.  ?Other people in your family  have had colorectal cancer or inflammatory bowel disease.     High-Fiber Diet  What is fiber?  This is a substance found in some fruits, vegetables, and grains. Most fiber passes through your body without being digested. But it can affect how you digest other foods, and it can also improve your bowel movements.  There are 2 kinds of fiber:  ?"Soluble fiber" is found in fruits, oats, barley, beans, and peas.  ?"Insoluble fiber" is found in wheat, rye, and other grains.  Both kinds of fiber you eat are called "dietary fiber."  Why is fiber important for good health?  Fiber can help make your bowel movements softer and more regular. Adding fiber to your diet can help with problems like constipation, hemorrhoids, and diarrhea. Plus, it can help prevent "accidents" if you have trouble controlling your bowel movements.  Getting enough fiber can also help lower your risk of heart disease, stroke, and type 2 diabetes. That's because fiber can help lower cholesterol and control blood sugar.  How much fiber do I need?  The recommended amount of fiber is 20 to 35 grams a day. The nutrition label on packaged foods shows you how much fiber you are getting in each serving (figure 1).  How can I make sure I'm getting enough fiber?  Eat plenty of the fruits, vegetables, and grains that contain fiber (table 1 and figure 2). Many breakfast cereals also have a lot of fiber.  If you can't get enough fiber from food, you can add wheat bran to the foods you do eat. Or you can take fiber supplements. These come as powders, wafers, or pills. They include psyllium seed (sample brand names: Metamucil, Konsyl), methylcellulose (sample brand name: Citrucel), polycarbophil (sample brand name: FiberCon), and wheat dextrin (sample brand name: Benefiber). If you take a fiber supplement, read the label so you know how much to take. If you're not sure, ask your doctor or nurse.  What are the side effects of  fiber?  When you start eating more fiber, your belly might feel bloated, or you might have gas or cramps. You can avoid these side effects by adding fiber to your diet slowly.  Some people feel worse when they eat more fiber or take fiber supplements. If you feel worse after adding more fiber to your diet, you can try decreasing the amount of fiber to see if that helps.

## 2023-10-23 NOTE — Progress Notes (Signed)
 Last mammo 07/12/2023 WNL Period changing. Sometimes heavy sometimes light cycle

## 2023-10-24 ENCOUNTER — Other Ambulatory Visit: Payer: Self-pay | Admitting: Physician Assistant

## 2023-10-24 LAB — CERVICOVAGINAL ANCILLARY ONLY
Bacterial Vaginitis (gardnerella): POSITIVE — AB
Candida Glabrata: NEGATIVE
Candida Vaginitis: NEGATIVE
Chlamydia: NEGATIVE
Comment: NEGATIVE
Comment: NEGATIVE
Comment: NEGATIVE
Comment: NEGATIVE
Comment: NEGATIVE
Comment: NORMAL
Neisseria Gonorrhea: NEGATIVE
Trichomonas: NEGATIVE

## 2023-11-17 ENCOUNTER — Other Ambulatory Visit: Payer: Self-pay

## 2023-11-17 DIAGNOSIS — B9689 Other specified bacterial agents as the cause of diseases classified elsewhere: Secondary | ICD-10-CM

## 2023-11-17 MED ORDER — METRONIDAZOLE 500 MG PO TABS: 500.0000 mg | ORAL_TABLET | Freq: Two times a day (BID) | ORAL | 0 refills | Status: AC

## 2023-11-17 NOTE — Progress Notes (Signed)
 Pt called left VM, never received RX for BV treatment, still has symptoms, requesting again. Flagyl RX sent per protocol
